# Patient Record
Sex: Male | Born: 1940
Health system: Southern US, Community
[De-identification: ages and names within clinical notes are randomized; demographics above are authoritative.]

## PROBLEM LIST (undated history)

## (undated) DIAGNOSIS — K649 Unspecified hemorrhoids: Secondary | ICD-10-CM

## (undated) DIAGNOSIS — R6889 Other general symptoms and signs: Secondary | ICD-10-CM

## (undated) DIAGNOSIS — Z9289 Personal history of other medical treatment: Secondary | ICD-10-CM

## (undated) DIAGNOSIS — M1990 Unspecified osteoarthritis, unspecified site: Secondary | ICD-10-CM

## (undated) DIAGNOSIS — Z8249 Family history of ischemic heart disease and other diseases of the circulatory system: Secondary | ICD-10-CM

## (undated) DIAGNOSIS — G4730 Sleep apnea, unspecified: Secondary | ICD-10-CM

## (undated) HISTORY — DX: Personal history of other medical treatment: Z92.89

## (undated) HISTORY — PX: KNEE ARTHROSCOPY: SUR90

## (undated) HISTORY — DX: Family history of ischemic heart disease and other diseases of the circulatory system: Z82.49

## (undated) HISTORY — PX: CATARACT EXTRACTION, BILATERAL: SHX1313

---

## 1994-12-16 DIAGNOSIS — Z9289 Personal history of other medical treatment: Secondary | ICD-10-CM

## 1994-12-16 HISTORY — DX: Personal history of other medical treatment: Z92.89

## 2001-09-03 ENCOUNTER — Ambulatory Visit (HOSPITAL_COMMUNITY): Admission: RE | Admit: 2001-09-03 | Discharge: 2001-09-03 | Payer: Self-pay | Admitting: Specialist

## 2001-09-03 ENCOUNTER — Encounter: Payer: Self-pay | Admitting: Family Medicine

## 2001-09-30 ENCOUNTER — Ambulatory Visit (HOSPITAL_COMMUNITY): Admission: RE | Admit: 2001-09-30 | Discharge: 2001-09-30 | Payer: Self-pay | Admitting: General Surgery

## 2004-05-07 ENCOUNTER — Ambulatory Visit: Admission: RE | Admit: 2004-05-07 | Discharge: 2004-05-07 | Payer: Self-pay | Admitting: Internal Medicine

## 2005-03-26 ENCOUNTER — Ambulatory Visit (HOSPITAL_COMMUNITY): Admission: RE | Admit: 2005-03-26 | Discharge: 2005-03-26 | Payer: Self-pay | Admitting: Family Medicine

## 2006-07-01 ENCOUNTER — Ambulatory Visit (HOSPITAL_COMMUNITY): Admission: RE | Admit: 2006-07-01 | Discharge: 2006-07-01 | Payer: Self-pay | Admitting: General Surgery

## 2007-09-07 ENCOUNTER — Ambulatory Visit: Payer: Self-pay | Admitting: Orthopedic Surgery

## 2007-09-11 DIAGNOSIS — Z8679 Personal history of other diseases of the circulatory system: Secondary | ICD-10-CM | POA: Insufficient documentation

## 2007-11-02 ENCOUNTER — Encounter: Payer: Self-pay | Admitting: Orthopedic Surgery

## 2007-11-20 ENCOUNTER — Encounter: Payer: Self-pay | Admitting: Orthopedic Surgery

## 2008-01-05 ENCOUNTER — Inpatient Hospital Stay (HOSPITAL_COMMUNITY): Admission: RE | Admit: 2008-01-05 | Discharge: 2008-01-08 | Payer: Self-pay | Admitting: Orthopedic Surgery

## 2008-01-06 ENCOUNTER — Ambulatory Visit: Payer: Self-pay | Admitting: Physical Medicine & Rehabilitation

## 2008-01-08 ENCOUNTER — Inpatient Hospital Stay: Admission: AD | Admit: 2008-01-08 | Discharge: 2008-01-15 | Payer: Self-pay | Admitting: Internal Medicine

## 2008-04-25 ENCOUNTER — Ambulatory Visit (HOSPITAL_BASED_OUTPATIENT_CLINIC_OR_DEPARTMENT_OTHER): Admission: RE | Admit: 2008-04-25 | Discharge: 2008-04-26 | Payer: Self-pay | Admitting: Orthopedic Surgery

## 2010-05-17 ENCOUNTER — Ambulatory Visit (HOSPITAL_COMMUNITY): Admission: RE | Admit: 2010-05-17 | Discharge: 2010-05-17 | Payer: Self-pay | Admitting: Ophthalmology

## 2010-06-07 ENCOUNTER — Ambulatory Visit (HOSPITAL_COMMUNITY): Admission: RE | Admit: 2010-06-07 | Discharge: 2010-06-07 | Payer: Self-pay | Admitting: Ophthalmology

## 2011-03-04 LAB — BASIC METABOLIC PANEL
BUN: 14 mg/dL (ref 6–23)
CO2: 29 mEq/L (ref 19–32)
Calcium: 9.4 mg/dL (ref 8.4–10.5)
Chloride: 104 mEq/L (ref 96–112)
Creatinine, Ser: 1.17 mg/dL (ref 0.4–1.5)
GFR calc Af Amer: >60 mL/min (ref >60)
GFR calc non Af Amer: >60 mL/min (ref >60)
Glucose, Bld: 111 mg/dL — ABNORMAL HIGH (ref 70–99)
Potassium: 3.7 mEq/L (ref 3.5–5.1)
Sodium: 139 mEq/L (ref 135–145)

## 2011-03-04 LAB — HEMOGLOBIN AND HEMATOCRIT, BLOOD
HCT: 42 % (ref 39.0–52.0)
Hemoglobin: 14.7 g/dL (ref 13.0–17.0)

## 2011-04-30 NOTE — Op Note (Signed)
NAME:  SALAM, MICUCCI NO.:  [id number]   MEDICAL RECORD NO.:  [id number]          PATIENT TYPE:  INP   LOCATION:  0005                         FACILITY:  [facility]   PHYSICIAN:  Madlyn Frankel. Charlann Boxer, M.D.  DATE OF BIRTH:  [birth year]   DATE OF PROCEDURE:  01/05/2008  DATE OF DISCHARGE:                               OPERATIVE REPORT   SURGEON:  Madlyn Frankel. Charlann Boxer, MD.   ASSISTANT:  Dwyane Luo, PA-C.   PREOPERATIVE DIAGNOSIS:  Bilateral medial knee osteoarthritis status  post left partial knee replacement.   POSTOPERATIVE DIAGNOSIS:  Bilateral medial knee osteoarthritis status  post left partial knee replacement.   PROCEDURE:  Right partial knee replacement, part of bilateral partial  knee replacement.   ANESTHESIA:  General.   Components and utilizers dictated in the previous note, but on the right  knee, it was a size medium femur, right medial size C tibia with 3 mm  insert.   TOURNIQUET TIME:  42 minutes at 250 mmHg.   DRAINS:  Drains x1.   INDICATIONS FOR PROCEDURE:  Later as previously noted.   PROCEDURE IN DETAIL:  The patient was brought to the operative theater.  After the left knee had been completed and dressed sterilely with ice  pack, attention was directed to the right knee.  A tourniquet was placed  into the right thigh and positioned on the leg holder.  At this point, I  pre-scrubbed and prepped and draped this right knee in sterile fashion.  The leg was exsanguinated and the tourniquet elevated to 250 mmHg.   A midline incision was made followed by median parapatellar arthrotomy.  Following initial debridement, I debrided osteophytes off the medial  femur from the anterior to the middle portion as well as in the notch.  Following a slight proximal exposure of the proximal tibia, I placed the  extramedullary device and resected the bone using a reciprocating saw  and then an oscillating saw.  The cut surface at this point was measured  to be more  compatible with size C components as opposed to the B.  My  flexion gap at this point was perfect with a size 3 and the lower  cemented with a 4.  I was happy with this.  At this point, a starting  awl was used to create a hole in the distal femur 1 cm anterior to the  posterior cruciate ligament insertion.  An intramedullary rod was then  passed.  Using the posterior cutting guide, I then positioned it on top  of a 3 feeler gauge, and once it was oriented in all planes parallel and  perpendicular and in the center portion of the medial femoral condyle, I  drilled in position.  The posterior cutting block was then placed and  the posterior femoral cut made.  At this point, the zero spigot was  placed and the distal femur milled.  A trial reduction was carried out.  At this point, again, I was happy with a size 3 in flexion.  However, it  was very tight in extension, even with a size 1.  For this reason, I  chose a size of 4 spigot.  The 4 spigot was placed and the distal femur  milled after using an osteotome to debride the remaining bone as well as  a rongeur in the central portion.  I retrialed with a size medium femur  and 3 insert.  At this point, the knee extension and flexion gaps  appeared to be very well balanced.  At this point, I removed the trial  femur, pinned the trial tibial component in place, and used a  reciprocating saw, debrided the trunk.  I cleared this out with an  appropriate chisel.  At this point, I drilled until the final components  were opened.  I irrigated the knee copiously with normal saline  solution.  Cement was mixed in 2 batches, first in the tibia and then  the femur.   The components were cemented into position in 2 cement techniques,  removing the cement after the tibial component using the trial femur and  the feeler gauge.  The procedure was repeated with the femoral  component.  After the cement had cured, I removed excessive cement and  was  satisfied there were no loose fragments of cement throughout the  wound.  I retrialed and was happy with a size 3 component.  The right  medial size 3 insert was then pushed in with my thumb without  significant issues.  I again irrigated this side of the knee.  The  tourniquet was let down at 42 minutes.  A medium Hemovac drain was  placed deep.  This synovial capsule layer was injected with 0.25%  Marcaine with epinephrine and Toradol.  The remaining portion from the  60 mL .   The extensor mechanism was then reapproximated using #1 Vicryl, 2-0  Vicryl in the subcutaneous layer, and a running 4-0 Monocryl.  The knee  was cleaned, dried, dressed sterilely with Steri-Strips, and a bulky  sterile wrap.  He was brought to the recovery room in stable condition.      Madlyn Frankel Charlann Boxer, M.D.  Electronically Signed     MDO/MEDQ  D:  01/05/2008  T:  01/05/2008  Job:  [id number]   cc:   Sharlet Salina L. [address], [place]

## 2011-04-30 NOTE — Op Note (Signed)
NAME:  Chris Taylor, Chris Taylor            ACCOUNT NO.:  [id number]   MEDICAL RECORD NO.:  [id number]          PATIENT TYPE:  AMB   LOCATION:  [facility]                         FACILITY:  [facility]   PHYSICIAN:  Madlyn Frankel. Charlann Boxer, M.D.  DATE OF BIRTH:  05-01-1941   DATE OF PROCEDURE:  04/25/2008  DATE OF DISCHARGE:                               OPERATIVE REPORT   PREOPERATIVE DIAGNOSIS:  Retained and fragmented cement following  partial knee replacement right knee.   POSTOPERATIVE DIAGNOSIS:  1. Cement fragment identified and removed.  2. Grade 3 changes in the patellofemoral compartment.  3. Synovitis and scarring.   PROCEDURE:  1. Right knee diagnostic and operative arthroscopy with a significant      synovectomy and scar debridement allowing for #2.  2. Removal of cement fragment.  3. Patellofemoral chondroplasty.   SURGEON:  Madlyn Frankel. Charlann Boxer, M.D.   ASSISTANT:  None.   ANESTHESIA:  General plus locally applied anesthesia.   COMPLICATIONS:  None.   DRAINS:  None.   INDICATIONS FOR PROCEDURE:  The patient is a very pleasant 70 year old  gentleman who has undergone bilateral partial knee replacements.  Unfortunately he had a fragment of cement break off that became  symptomatic to him.  This was identified radiographically.  Given the  mechanical symptoms he was having, he wished to have it removed and I  felt this was very justifiable.  We did discuss the risks and benefits  of knee arthroscopy in this setting including infection, the plan to use  antibiotics postoperatively to help prevent that, in addition to the  risks of persistent discomfort as well as the potential for finding of  the problems that are addressed inside the knee.  Consent was obtained.   DESCRIPTION OF PROCEDURE:  The patient was brought to the operating  room.  Once adequate anesthesia, preoperative antibiotics, Ancef,  administered, the patient was positioned supine in the right leg in a  leg holder.  The right  lower extremity was prescrubbed and prepped and  draped in the usual sterile fashion.  Standard inferolateral and  superior medial portals were utilized.  I used a horizontal portal  medially which was away from the initial incision for the index  procedure.  Diagnostic evaluation of the knee initially revealed a lot  of synovitis.   Through the inferomedial portal as my working portal, a lot of the  procedure initially was performed as synovectomy anterior medial and  lateral including the scar tissue.  After the considerable amount of  time of this debridement, I was able to identify the fragment of cement.  Using the pituitary rongeur in the extended incision medially I was able  to remove this cement.  It did break into two pieces.  There were no  other fragments of cement identified.   Following the synovectomy, I also identified some anterior compartment  osteoarthritic changes for which I used a 3.5 shaver to debride back to  stable levels.   Following the removal of cement, the synovectomy, and the chondroplasty,  I reexamined the knee to make sure there was no loose fragments or  cartilage.  The patient's partial knee replacement components looked  like they were in excellent position and without any signs of wear or  damage.  Following this, I removed the instrumentation.  I reclosed the  superior medial and inferolateral portals routinely with nylon.  I then  used a 2-0 Vicryl and nylon on the inferior medial portal.  At this  point the knee was reinjected with 30 mL of 0.25% Marcaine with  epinephrine.  Preoperatively, I had utilized lidocaine with epinephrine.  The knee was then dressed into a sterile bulky subcutaneous dressing.  He was brought to the recovery room in stable condition tolerating the  procedure well.  The plan is for him to spend the night overnight for  observation and discharge in the morning.      Madlyn Frankel Charlann Boxer, M.D.  Electronically  Signed     MDO/MEDQ  D:  04/25/2008  T:  04/25/2008  Job:  [id number]

## 2011-04-30 NOTE — Op Note (Signed)
NAME:  Chris Taylor, Chris Taylor NO.:  [id number]   MEDICAL RECORD NO.:  [id number]          PATIENT TYPE:  INP   LOCATION:  0005                         FACILITY:  [facility]   PHYSICIAN:  Madlyn Frankel. Charlann Boxer, M.D.  DATE OF BIRTH:  04-04-1941   DATE OF PROCEDURE:  01/05/2008  DATE OF DISCHARGE:                               OPERATIVE REPORT   PREOPERATIVE DIAGNOSIS:  Bilateral medial compartment osteoarthritis.   POSTOPERATIVE DIAGNOSIS:  Bilateral medial compartment osteoarthritis.   PROCEDURE:  Bilateral partial knee replacement using [employer] components.  The left knee had a size medium femur, a size B medial tibia, and a 3 mm  insert. The right side was a medium femur, a size medial size C tibia,  and 3 mm insert.   SURGEON:  Madlyn Frankel. Charlann Boxer, M.D.   ASSISTANT:  Yetta Glassman. Mann, P.A.-C.   ANESTHESIA:  General.   ESTIMATED BLOOD LOSS:  Approximately to 200 mL for both sides.   DRAINS:  Two.   TOURNIQUET TIME:  On the left was 57 minutes at 250 mmHg, on the right  side 42 minutes at 250 mmHg.   COMPLICATIONS:  None.   INDICATIONS:  Mr. Baumgardner is a 70 year old gentleman kindly referred  for evaluation of bilateral knee osteoarthritis, medial compartment  predominant pain.  The patient had specifically located the medial side  of his knee based on this and his radiographs indicated end stage medial  compartment osteoarthritis, he wished to discuss total knee versus  partial knee replacement.  The risks and benefits were discussed  extensively including the potential progression of arthritis, persistent  postop pain in the patellofemoral and lateral components requiring  revision to total knee replacement, in addition to standard risks of  infection, DVT, component failure, need for revision.  Consent was  obtained.   PROCEDURE IN DETAIL:  This is for the left arthroplasty, the right knee  will be dictated separately with the same general information   The patient  was brought to the operative theater.  Once adequate  anesthesia and preoperative antibiotics, Ancef, were administered, the  patient was positioned supine.  A left thigh tourniquet was placed.  The  left leg was then positioned on the leg holder to allow for flexion to  120 degrees. At this point, I pre-scrubbed and prepped and draped the  left knee.  The leg was exsanguinated and tourniquet elevated to 250  mmHg.  A paramidline incision was made from the patella to the tubercle.  Exposure of the soft tissues and releasing the subcu layers were carried  out to allow for tissue mobilization.  A partial median arthrotomy was  created followed by debridement.  Following partial exposure of the  proximal medial tibia, I then removed all osteophytes off the femur from  the anterior back to the most medial side of the knee as well as into  the notch.  At this point, the extramedullary guide was positioned over  the proximal tibia and oriented in the correct planes.  I pinned it in  position and used the reciprocating saw and then the oscillating saw. I  checked with a size B tibia and the 3 and 4 feeler gauge, happy with the  3 and I decided, based on this, not to increase my cuts.  At this point,  the starting awl was placed into the distal femur and a guide rod passed  into the center of the canal.   At this point, I positioned the posterior femoral cutting jig on the mid  portion of the distal femur perpendicular and parallel in all planes  necessary in relationship to the intramedullary rod.  Once this was  done, I pinned and drilled the holes.  The posterior cutting block had  been placed and the posterior femoral cut made.  At this point, I used a  0 spigot and milled off the distal femur.  I trialed with a medium  femur, B tibia, and a 3 insert. In flexion, I was happy with the laxity  of the tissues and at extension, I had a hard time getting the 1 spacer  into 20 degrees of extension.   At this point, I decided I would use a 3  spigot and the 3 spigot was placed and the femur milled using an  osteotome. I debrided bone as well as using a rongeur to remove some of  the bone from the distal aspect of the knee.  A trial reduction was  again carried out.  At this point, with the trial components in, the 3  spacer felt very nice.  At this point, the trial femur was removed, the  tibial component trial was pinned in position and the reciprocating saw  used to the create the trough.  I then used the small chisel designed to  create this trough to remove extra.  At this point, a keeled trial was  carried out and the size B left medial tibia, medium size femur, and a 3  trial.  The stability of the knee from extension to flexion was very  good.  I used an osteotome to debride out a notch in the anterior aspect  of the distal femur to prevent impingement of the trial component.  At  this point, all trial components were removed and the final components  were opened holding the polyethylene insert.  I then used a drill to  drill into the sclerotic bone.  I irrigated the knee copiously and  injected 30 mL of 0.25% Marcaine with epinephrine and 1 mL of Toradol  into the synovial capsule layer.  The cement was mixed.  The components  were cemented in position, first on the tibia and extruded it out at 45  degrees, a trial was then placed removing excessive cement in the femur.  Once the cement had cured, with both components in place, I assured  myself there were no loose fragments of cement.  I re-trialed and was  happy with a size 3 insert.  At this point, the final size 3 insert for  the left medial component was opened and inserted with good thumb  pressure.  I reirrigated the knee at this point and placed a medium  Hemovac drain.  With the knee in flexion, I reapproximated the extensor  mechanism with #1 Vicryl.  The remainder of the wound was closed with 2-  0 Vicryl and running  4-0 Monocryl.  At this point, I went ahead and  closed this entire wound with Steri-Strips and a bulky sterile wrap.  The knee was then taken out of the leg holder and positioned  on the side  of the bed with an ice pack.   This is the end of the procedure on the left knee.      Madlyn Frankel Charlann Boxer, M.D.  Electronically Signed     MDO/MEDQ  D:  01/05/2008  T:  01/05/2008  Job:  [id number]

## 2011-04-30 NOTE — Discharge Summary (Signed)
Chris Taylor, Chris Taylor NO.:  [id number]   MEDICAL RECORD NO.:  [id number]          PATIENT TYPE:  INP   LOCATION:  1608                         FACILITY:  [facility]   PHYSICIAN:  Madlyn Frankel. Charlann Boxer, M.D.  DATE OF BIRTH:  12-15-1941   DATE OF ADMISSION:  01/05/2008  DATE OF DISCHARGE:  01/08/2008                               DISCHARGE SUMMARY   ADMITTING DIAGNOSES:  1. Osteoarthritis.  2. Hypertension.  3. Sleep apnea.   DISCHARGE DIAGNOSES:  1. Osteoarthritis.  2. Hypertension.  3. Sleep apnea.   CONSULTATION:  None.   PROCEDURE:  Bilateral partial knee replacement.  Surgeon Dr. Durene Romans.  Assistant Dwyane Luo PA.   BRIEF HISTORY OF PRESENT ILLNESS:  Chris Taylor is a very pleasant 69-  year-old male with a history of bilateral knee pain with mainly medial  compartment degenerative changes secondary to osteoarthritis.  He was  refractory to all conservative treatment including oral anti-  inflammatories and cortisone injection.  Presurgically assessed and  cleared for bilateral partial knee replacement.   LABORATORY DATA:  Preadmission CBC:  Hematocrit 42.1; tracked throughout  his course of stay, had a dip down to 34, was stable at time of  discharge at 32.3.  CMET on admission:  All within normal limits; good  kidney function, potassium 9.7, GFR greater than 60.  Tracked throughout  his course of stay.  No significant trending.  Was stable at discharge  with sodium 142, potassium 4.1, creatinine 1.15, GFR greater than 60,  potassium 8.5.  Glucose 100.  Preadmission UA was negative.   RADIOLOGY:  Chest two-view showed no acute disease.  No EKG could be  found on chart at time of discharge.   HOSPITAL COURSE:  The patient underwent bilateral partial knee  replacement, tolerated the procedure well, was admitted to the  orthopedic floor.  During his rehab stay on the orthopedic floor was  unremarkable.  He made good progress, had no medical complications.   He  remained afebrile.  Hemodynamically, he remained stable without the need  for transfusion.  Dressing was changed on a daily basis with no sign of  infection, no ooze.  He was able to independently transfer from bed to  chair and chair to bathroom.  I made good progress with his ambulation.  He had good strength.  Pain was well-controlled as well.  After three  days, he was stable, improved, ready for discharge to a skilled nursing  facility rehab facility for further progress as he does live alone.   DISCHARGE DISPOSITION:  Discharged in stable and improved condition to  skilled nursing facility rehab for further progress.   DISCHARGE PHYSICAL THERAPY:  He is weightbearing as tolerated bilateral  lower extremities with use of rolling walker for approximately 2 weeks  and then transfer to single-point cane as tolerated.  Goals of physical  therapy will be to work on gait training, proprioception, minimize pain,  increase strength and encourage independence of activities of daily  living.   DISCHARGE WOUND CARE:  Keep wounds dry.  Change dressing on a daily  basis.  If  the patient does want to shower, cover with plastic, tape  edges, do not get wet.   DISCHARGE MEDICATIONS:  1. Benicar hydrochlorothiazide 20/12.5 one p.o. daily.  2. Aspirin 81 mg 1 p.o. daily.  3. Omega 3 hold for 2 weeks.  4. Cod liver hold for 2 weeks.  5. Flaxseed oil hold for 2 weeks.  6. Vitamin B12 1000 mcg daily.  7. Benadryl 25 mg p.o. q.6h. p.r.n.  8. Celebrex 200 mg 1 p.o. daily x2 weeks.  9. Lovenox 40 mg subcutaneous q.24h. for 11 days.  10.Robaxin 500 mg 1 p.o. q.6h. p.r.n. muscle spasm pain.  11.Vicodin 5/325 one to two p.o. q.4-6h. p.r.n. pain.  12.Iron 325 mg 1 p.o. t.i.d. x2 weeks.  13.After Lovenox completed, enteric-coated aspirin 325 mg 1 p.o. daily      for 4 weeks, after Lovenox completed.  This would be as a      substitute for the 81-mg aspirin.  14.Colace 100 mg p.o. daily.   15.MiraLax 17 g p.o. daily.  16.Enema of choice, laxative of choice.   DISCHARGE FOLLOWUP:  Follow up with Dr. Charlann Boxer at phone number [phone] in  2 weeks for follow-up wound check.   DISCHARGE DIET:  Regular as tolerated by the patient.     ______________________________  Yetta Glassman. [address], [place]      Madlyn Frankel. Charlann Boxer, M.D.  Electronically Signed    BLM/MEDQ  D:  01/08/2008  T:  01/08/2008  Job:  [id number]

## 2011-04-30 NOTE — H&P (Signed)
NAME:  Chris Taylor, SERPE NO.:  [id number]   MEDICAL RECORD NO.:  [id number]          PATIENT TYPE:  INP   LOCATION:  NA                           FACILITY:  [facility]   PHYSICIAN:  Madlyn Frankel. Charlann Boxer, M.D.  DATE OF BIRTH:  01-08-1941   DATE OF ADMISSION:  DATE OF DISCHARGE:                              HISTORY & PHYSICAL   DATE OF ADMISSION:  January 05, 2008 to [facility], surgeon  Dr. Durene Romans.   PROCEDURE:  Bilateral partial knee replacement.   CHIEF COMPLAINT:  Bilateral knee pain.   HISTORY OF PRESENT ILLNESS:  70 year old male with a history of  bilateral knee pain with mainly medial compartment degenerative changes  secondary to osteoarthritis.  He has been refractory to all conservative  treatment.  He has been presurgical assessed by Melony Overly, physician  assistant with [facility].   PAST MEDICAL HISTORY:  Significant for osteoarthritis, hypertension.   PAST SURGICAL HISTORY:  None.   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  Primary caregiver will be a rehab facility postoperatively.   DRUG ALLERGIES:  NO KNOWN DRUG ALLERGIES.   MEDICATIONS:  1. Benicar 20 mg one p.o. daily  2. Aspirin 81 mg one p.o. daily.   REVIEW OF SYSTEMS:  None other than HPI.   PHYSICAL EXAMINATION:  Pulse 72, respirations 18, blood pressure 98/54.  GENERAL:  Awake, alert and oriented, well-developed, well-nourished, no  acute distress.  NECK:  Supple.  No carotid bruits.  CHEST:  Lungs are clear to auscultation bilaterally.  BREASTS:  Deferred.  HEART:  Regular rate and rhythm.  S1-S2 distinct.  ABDOMEN:  Soft, nontender, bowel sounds present.  GENITOURINARY:  Deferred.  EXTREMITIES:  Dorsalis pedis pulse positive bilateral lower extremities.  Bilateral knees have mild flexion contracture.  SKIN:  No cellulitis.  NEUROLOGIC:  Intact distal sensibilities.   Labs, EKG, chest x-ray all pending presurgical  testing.   IMPRESSION:  Bilateral medial compartment osteoarthritis.   PLAN OF ACTION:  Bilateral unicondylar knee arthroplasty.  Surgeon Dr.  Durene Romans, January 05, 2008 [facility].  Risks,  complications were discussed.   Postoperative rehab would be to in-house rehab, whether [facility] or  would prefer [facility] as that is near his  home.  Therefore upon  admission and postoperatively we would seek rehab placement due to the  nature of bilateral knee replacement at [facility].     ______________________________  Yetta Glassman. [address], [place]      Madlyn Frankel. Charlann Boxer, M.D.  Electronically Signed    BLM/MEDQ  D:  12/31/2007  T:  12/31/2007  Job:  [id number]   cc:   Melony Overly, PA   Corrie Mckusick, M.D.  Fax: [phone]

## 2011-05-03 NOTE — H&P (Signed)
NAME:  SPARSH, CALLENS NO.:  [id number]   MEDICAL RECORD NO.:  [id number]           PATIENT TYPE:  AMB   LOCATION:                                FACILITY:  [facility]   PHYSICIAN:  Dalia Heading, M.D.  DATE OF BIRTH:  Jun 07, 1941   DATE OF ADMISSION:  DATE OF DISCHARGE:  LH                                HISTORY & PHYSICAL   CHIEF COMPLAINT:  Family history of colon carcinoma.   HISTORY OF PRESENT ILLNESS:  The patient is a 70 year old black male who is  referred for endoscopic evaluation.  He needs a colonoscopy for family  history of colon carcinoma.  His brother died of colon cancer.  No abdominal  pain, weight loss, nausea, vomiting, diarrhea, constipation, melena, or  hematochezia have been noted.  He last had a colonoscopy 6 years ago.   PAST MEDICAL HISTORY:  Unremarkable.   PAST SURGICAL HISTORY:  Unremarkable.   CURRENT MEDICATIONS:  Benicar, baby aspirin.   ALLERGIES:  No known drug allergies.   REVIEW OF SYSTEMS:  Noncontributory.   PHYSICAL EXAMINATION:  GENERAL:  The patient is a well-developed, well-  nourished black male in no acute distress.  LUNGS:  Clear to auscultation with equal breath sounds bilaterally.  HEART:  Reveals a regular rate and rhythm without S3, S4, or murmurs.  ABDOMEN:  Soft, nontender, nondistended.  No hepatosplenomegaly or masses  noted.  RECTAL:  Examination was deferred to the procedure.   IMPRESSION:  Family history of colon carcinoma.   PLAN:  The patient is scheduled for a colonoscopy on July 01, 2006.  The  risks and benefits of the procedure including bleeding and perforation were  fully explained to the patient, who gave informed consent.      Dalia Heading, M.D.  Electronically Signed     MAJ/MEDQ  D:  06/26/2006  T:  06/26/2006  Job:  [id number]   cc:   Corrie Mckusick, M.D.  Fax: [phone]

## 2011-09-05 LAB — URINALYSIS, ROUTINE W REFLEX MICROSCOPIC
Bilirubin Urine: NEGATIVE
Glucose, UA: NEGATIVE
Hgb urine dipstick: NEGATIVE
Ketones, ur: NEGATIVE
Nitrite: NEGATIVE
Protein, ur: NEGATIVE
Specific Gravity, Urine: 1.029
Urobilinogen, UA: 0.2
pH: 6.5

## 2011-09-05 LAB — TYPE AND SCREEN

## 2011-09-05 LAB — CBC
HCT: 32.3 — ABNORMAL LOW
HCT: 34 — ABNORMAL LOW
Hemoglobin: 11.7 — ABNORMAL LOW
MCHC: 34.6
MCV: 93
MCV: 93.8
MCV: 94.7
Platelets: 177
Platelets: 216
RBC: 4.53
RDW: 12.8
RDW: 12.8
RDW: 13
WBC: 7.9

## 2011-09-05 LAB — BASIC METABOLIC PANEL
BUN: 11
BUN: 15
CO2: 30
Calcium: 9.7
Chloride: 103
Chloride: 105
Chloride: 109
Creatinine, Ser: 1.19
GFR calc non Af Amer: >60
Glucose, Bld: 100 — ABNORMAL HIGH
Glucose, Bld: 102 — ABNORMAL HIGH
Glucose, Bld: 97
Potassium: 4
Potassium: 4.1
Sodium: 139

## 2011-09-05 LAB — DIFFERENTIAL
Basophils Absolute: 0
Basophils Relative: 1
Eosinophils Absolute: 0.2
Monocytes Relative: 11
Neutro Abs: 1.5 — ABNORMAL LOW
Neutrophils Relative %: 41 — ABNORMAL LOW

## 2011-09-05 LAB — PROTIME-INR: INR: 1

## 2012-01-17 ENCOUNTER — Telehealth: Payer: Self-pay | Admitting: Family

## 2012-01-17 NOTE — Telephone Encounter (Signed)
Please call pt and let him know that his stress test is normal.

## 2012-01-17 NOTE — Telephone Encounter (Signed)
Phone note opened in error on wrong patient.

## 2013-05-13 ENCOUNTER — Encounter (HOSPITAL_COMMUNITY): Payer: Self-pay | Admitting: Pharmacy Technician

## 2013-05-13 NOTE — Patient Instructions (Addendum)
Chris Taylor  05/13/2013   Your procedure is scheduled on:   05/17/2013  Report to [facility] at  1025  AM.  Call this number if you have problems the morning of surgery: [phone]   Remember:   Do not eat food or drink liquids after midnight.   Take these medicines the morning of surgery with A SIP OF WATER: Namenda   Do not wear jewelry, make-up or nail polish.  Do not wear lotions, powders, or perfumes.   Do not shave 48 hours prior to surgery. Men may shave face and neck.  Do not bring valuables to the hospital.  [facility] is not responsible   for any belongings or valuables.  Contacts, dentures or bridgework may not be worn into surgery.  Leave suitcase in the car. After surgery it may be brought to your room.  For patients admitted to the hospital, checkout time is 11:00 AM the day of discharge.   Patients discharged the day of surgery will not be allowed to drive  home.  Name and phone number of your driver: family  Special Instructions: Shower using CHG 2 nights before surgery and the night before surgery.  If you shower the day of surgery use CHG.  Use special wash - you have one bottle of CHG for all showers.  You should use approximately 1/3 of the bottle for each shower.   Please read over the following fact sheets that you were given: Pain Booklet, Coughing and Deep Breathing, MRSA Information, Surgical Site Infection Prevention, Anesthesia Post-op Instructions and Care and Recovery After Surgery Hemorrhoidectomy Hemorrhoidectomy is surgery to remove hemorrhoids. Hemorrhoids are veins that have become swollen in the rectum. The rectum is the area from the bottom end of the intestines to the opening where bowel movements leave the body. Hemorrhoids can be uncomfortable. They can cause itching, bleeding and pain if a blood clot forms in them (thrombose). If hemorrhoids are small, surgery may not be needed. But if they cover a larger area, surgery is usually suggested.   LET YOUR CAREGIVER KNOW ABOUT:   Any allergies.  All medications you are taking, including:  Herbs, eyedrops, over-the-counter medications and creams.  Blood thinners (anticoagulants), aspirin or other drugs that could affect blood clotting.  Use of steroids (by mouth or as creams).  Previous problems with anesthetics, including local anesthetics.  Possibility of pregnancy, if this applies.  Any history of blood clots.  Any history of bleeding or other blood problems.  Previous surgery.  Smoking history.  Other health problems. RISKS AND COMPLICATIONS All surgery carries some risk. However, hemorrhoid surgery usually goes smoothly. Possible complications could include:  Urinary retention.  Bleeding.  Infection.  A painful incision.  A reaction to the anesthesia (this is not common). BEFORE THE PROCEDURE   Stop using aspirin and non-steroidal anti-inflammatory drugs (NSAIDs) for pain relief. This includes prescription drugs and over-the-counter drugs such as ibuprofen and naproxen. Also stop taking vitamin E. If possible, do this two weeks before your surgery.  If you take blood-thinners, ask your healthcare provider when you should stop taking them.  You will probably have blood and urine tests done several days before your surgery.  Do not eat or drink for about 8 hours before the surgery.  Arrive at least an hour before the surgery, or whenever your surgeon recommends. This will give you time to check in and fill out any needed paperwork.  Hemorrhoidectomy is often an outpatient  procedure. This means you will be able to go home the same day. Sometimes, though, people stay overnight in the hospital after the procedure. Ask your surgeon what to expect. Either way, make arrangements in advance for someone to drive you home. PROCEDURE   The preparation:  You will change into a hospital gown.  You will be given an IV. A needle will be inserted in your arm.  Medication can flow directly into your body through this needle.  You might be given an enema to clear your rectum.  Once in the operating room, you will probably lie on your side or be repositioned later to lying on your stomach.  You will be given anesthesia (medication) so you will not feel anything during the surgery. The surgery often is done with local anesthesia (the area near the hemorrhoids will be numb and you will be drowsy but awake). Sometimes, general anesthesia is used (you will be asleep during the procedure).  The procedure:  There are a few different procedures for hemorrhoids. Be sure to ask you surgeon about the procedure, the risks and benefits.  Be sure to ask about what you need to do to take care of the wound, if there is one. AFTER THE PROCEDURE  You will stay in a recovery area until the anesthesia has worn off. Your blood pressure and pulse will be checked every so often.  You may feel a lot of pain in the area of the rectum.  Take all pain medication prescribed by your surgeon. Ask before taking any over-the-counter pain medicines.  Sometimes sitting in a warm bath can help relieve your pain.  To make sure you have bowel movements without straining:  You will probably need to take stool softeners (usually a pill) for a few days.  You should drink 8 to 10 glasses of water each day.  Your activity will be restricted for awhile. Ask your caregiver for a list of what you should and should not do while you recover. Document Released: 09/29/2009 Document Revised: 02/24/2012 Document Reviewed: 09/29/2009 [facility] Patient Information 2014 [place], [place]. PATIENT INSTRUCTIONS POST-ANESTHESIA  IMMEDIATELY FOLLOWING SURGERY:  Do not drive or operate machinery for the first twenty four hours after surgery.  Do not make any important decisions for twenty four hours after surgery or while taking narcotic pain medications or sedatives.  If you develop intractable  nausea and vomiting or a severe headache please notify your doctor immediately.  FOLLOW-UP:  Please make an appointment with your surgeon as instructed. You do not need to follow up with anesthesia unless specifically instructed to do so.  WOUND CARE INSTRUCTIONS (if applicable):  Keep a dry clean dressing on the anesthesia/puncture wound site if there is drainage.  Once the wound has quit draining you may leave it open to air.  Generally you should leave the bandage intact for twenty four hours unless there is drainage.  If the epidural site drains for more than 36-48 hours please call the anesthesia department.  QUESTIONS?:  Please feel free to call your physician or the hospital operator if you have any questions, and they will be happy to assist you.

## 2013-05-14 ENCOUNTER — Encounter (HOSPITAL_COMMUNITY)
Admission: RE | Admit: 2013-05-14 | Discharge: 2013-05-14 | Disposition: A | Discharge status: Home / Self Care | Payer: Medicare Other | Source: Ambulatory Visit | Attending: General Surgery | Admitting: General Surgery

## 2013-05-14 ENCOUNTER — Encounter (HOSPITAL_COMMUNITY): Payer: Self-pay

## 2013-05-14 HISTORY — DX: Unspecified hemorrhoids: K64.9

## 2013-05-14 HISTORY — DX: Sleep apnea, unspecified: G47.30

## 2013-05-14 HISTORY — DX: Other general symptoms and signs: R68.89

## 2013-05-14 LAB — BASIC METABOLIC PANEL
BUN: 12 mg/dL (ref 6–23)
Calcium: 9.3 mg/dL (ref 8.4–10.5)
Creatinine, Ser: 1.07 mg/dL (ref 0.50–1.35)
GFR calc non Af Amer: 67 mL/min — ABNORMAL LOW (ref >90)
Glucose, Bld: 83 mg/dL (ref 70–99)

## 2013-05-14 LAB — CBC WITH DIFFERENTIAL/PLATELET
Basophils Relative: 1 % (ref 0–1)
Eosinophils Absolute: 0.2 10*3/uL (ref 0.0–0.7)
MCH: 32.1 pg (ref 26.0–34.0)
MCHC: 35.5 g/dL (ref 30.0–36.0)
Monocytes Relative: 12 % (ref 3–12)
Neutrophils Relative %: 41 % — ABNORMAL LOW (ref 43–77)
Platelets: 183 10*3/uL (ref 150–400)

## 2013-05-17 ENCOUNTER — Ambulatory Visit (HOSPITAL_COMMUNITY): Payer: Medicare Other | Admitting: Anesthesiology

## 2013-05-17 ENCOUNTER — Encounter (HOSPITAL_COMMUNITY): Payer: Self-pay | Admitting: *Deleted

## 2013-05-17 ENCOUNTER — Encounter (HOSPITAL_COMMUNITY)
Admission: RE | Disposition: A | Discharge status: Home / Self Care | Payer: Self-pay | Source: Ambulatory Visit | Attending: General Surgery

## 2013-05-17 ENCOUNTER — Encounter (HOSPITAL_COMMUNITY): Payer: Self-pay | Admitting: Anesthesiology

## 2013-05-17 ENCOUNTER — Ambulatory Visit (HOSPITAL_COMMUNITY)
Admission: RE | Admit: 2013-05-17 | Discharge: 2013-05-17 | Disposition: A | Discharge status: Home / Self Care | Payer: Medicare Other | Source: Ambulatory Visit | Attending: General Surgery | Admitting: General Surgery

## 2013-05-17 DIAGNOSIS — I10 Essential (primary) hypertension: Secondary | ICD-10-CM | POA: Insufficient documentation

## 2013-05-17 DIAGNOSIS — G4733 Obstructive sleep apnea (adult) (pediatric): Secondary | ICD-10-CM | POA: Insufficient documentation

## 2013-05-17 DIAGNOSIS — K644 Residual hemorrhoidal skin tags: Secondary | ICD-10-CM | POA: Insufficient documentation

## 2013-05-17 HISTORY — PX: HEMORRHOID SURGERY: SHX153

## 2013-05-17 SURGERY — HEMORRHOIDECTOMY
Anesthesia: General | Wound class: Clean Contaminated

## 2013-05-17 MED ORDER — DEXTROSE 5 % IV SOLN
INTRAVENOUS | Status: AC
  Filled 2013-05-17: qty 2

## 2013-05-17 MED ORDER — ARTIFICIAL TEARS OP OINT
TOPICAL_OINTMENT | OPHTHALMIC | Status: AC
  Filled 2013-05-17: qty 3.5

## 2013-05-17 MED ORDER — LIDOCAINE HCL (PF) 1 % IJ SOLN
INTRAMUSCULAR | Status: AC
  Filled 2013-05-17: qty 5

## 2013-05-17 MED ORDER — FENTANYL CITRATE 0.05 MG/ML IJ SOLN
INTRAMUSCULAR | Status: DC | PRN
  Administered 2013-05-17 (×4): 25 ug via INTRAVENOUS
  Administered 2013-05-17 (×2): 50 ug via INTRAVENOUS

## 2013-05-17 MED ORDER — ONDANSETRON HCL 4 MG/2ML IJ SOLN
INTRAMUSCULAR | Status: AC
  Filled 2013-05-17: qty 2

## 2013-05-17 MED ORDER — LIDOCAINE HCL (CARDIAC) 10 MG/ML IV SOLN
INTRAVENOUS | Status: DC | PRN
  Administered 2013-05-17: 50 mg via INTRAVENOUS

## 2013-05-17 MED ORDER — MIDAZOLAM HCL 2 MG/2ML IJ SOLN
1.0000 mg | INTRAMUSCULAR | Status: DC | PRN
  Administered 2013-05-17: 2 mg via INTRAVENOUS

## 2013-05-17 MED ORDER — LIDOCAINE VISCOUS 2 % MT SOLN
OROMUCOSAL | Status: DC | PRN
  Administered 2013-05-17: 15 mL

## 2013-05-17 MED ORDER — FENTANYL CITRATE 0.05 MG/ML IJ SOLN
INTRAMUSCULAR | Status: AC
  Filled 2013-05-17: qty 2

## 2013-05-17 MED ORDER — CEFOXITIN SODIUM 2 G IV SOLR
2.0000 g | INTRAVENOUS | Status: AC
  Administered 2013-05-17: 2 g via INTRAVENOUS

## 2013-05-17 MED ORDER — SURGILUBE EX GEL
CUTANEOUS | Status: DC | PRN
  Administered 2013-05-17: 1 via TOPICAL

## 2013-05-17 MED ORDER — DOCUSATE SODIUM 100 MG PO CAPS: 100.0000 mg | ORAL_CAPSULE | Freq: Two times a day (BID) | ORAL | Status: DC

## 2013-05-17 MED ORDER — LIDOCAINE VISCOUS 2 % MT SOLN
OROMUCOSAL | Status: AC
  Filled 2013-05-17: qty 15

## 2013-05-17 MED ORDER — 0.9 % SODIUM CHLORIDE (POUR BTL) OPTIME
TOPICAL | Status: DC | PRN
  Administered 2013-05-17: 1000 mL

## 2013-05-17 MED ORDER — FENTANYL CITRATE 0.05 MG/ML IJ SOLN
25.0000 ug | INTRAMUSCULAR | Status: DC | PRN
Start: 2013-05-17 — End: 2013-05-17

## 2013-05-17 MED ORDER — BUPIVACAINE-EPINEPHRINE PF 0.5-1:200000 % IJ SOLN
INTRAMUSCULAR | Status: AC
  Filled 2013-05-17: qty 10

## 2013-05-17 MED ORDER — PROPOFOL 10 MG/ML IV EMUL
INTRAVENOUS | Status: AC
  Filled 2013-05-17: qty 20

## 2013-05-17 MED ORDER — CELECOXIB 100 MG PO CAPS: 400.0000 mg | ORAL_CAPSULE | Freq: Every day | ORAL | Status: DC

## 2013-05-17 MED ORDER — ONDANSETRON HCL 4 MG/2ML IJ SOLN
4.0000 mg | Freq: Once | INTRAMUSCULAR | Status: AC
  Administered 2013-05-17: 4 mg via INTRAVENOUS

## 2013-05-17 MED ORDER — LACTATED RINGERS IV SOLN
INTRAVENOUS | Status: DC
  Administered 2013-05-17 (×2): via INTRAVENOUS

## 2013-05-17 MED ORDER — GLYCOPYRROLATE 0.2 MG/ML IJ SOLN
INTRAMUSCULAR | Status: AC
  Filled 2013-05-17: qty 1

## 2013-05-17 MED ORDER — MIDAZOLAM HCL 2 MG/2ML IJ SOLN
INTRAMUSCULAR | Status: AC
  Filled 2013-05-17: qty 2

## 2013-05-17 MED ORDER — PROPOFOL 10 MG/ML IV BOLUS
INTRAVENOUS | Status: DC | PRN
  Administered 2013-05-17: 150 mg via INTRAVENOUS

## 2013-05-17 MED ORDER — ONDANSETRON HCL 4 MG/2ML IJ SOLN: 4.0000 mg | Freq: Once | INTRAMUSCULAR | Status: DC | PRN

## 2013-05-17 MED ORDER — BUPIVACAINE-EPINEPHRINE 0.5% -1:200000 IJ SOLN
INTRAMUSCULAR | Status: DC | PRN
  Administered 2013-05-17: 10 mL

## 2013-05-17 MED ORDER — HYDROCODONE-ACETAMINOPHEN 5-325 MG PO TABS: 1.0000 | ORAL_TABLET | ORAL | Status: DC | PRN

## 2013-05-17 MED ORDER — GLYCOPYRROLATE 0.2 MG/ML IJ SOLN
0.2000 mg | Freq: Once | INTRAMUSCULAR | Status: AC
  Administered 2013-05-17: 0.2 mg via INTRAVENOUS

## 2013-05-17 SURGICAL SUPPLY — 31 items
BAG HAMPER (MISCELLANEOUS) ×2 IMPLANT
CLOTH BEACON ORANGE TIMEOUT ST (SAFETY) ×2 IMPLANT
COVER LIGHT HANDLE STERIS (MISCELLANEOUS) ×4 IMPLANT
COVER MAYO STAND XLG (DRAPE) ×2 IMPLANT
DECANTER SPIKE VIAL GLASS SM (MISCELLANEOUS) ×2 IMPLANT
DRAPE PROXIMA HALF (DRAPES) ×2 IMPLANT
ELECT REM PT RETURN 9FT ADLT (ELECTROSURGICAL) ×2
ELECTRODE REM PT RTRN 9FT ADLT (ELECTROSURGICAL) ×1 IMPLANT
FORMALIN 10 PREFIL 120ML (MISCELLANEOUS) ×2 IMPLANT
GLOVE BIOGEL PI IND STRL 7.5 (GLOVE) ×1 IMPLANT
GLOVE BIOGEL PI INDICATOR 7.5 (GLOVE) ×1
GLOVE ECLIPSE 7.0 STRL STRAW (GLOVE) ×2 IMPLANT
GLOVE EXAM NITRILE MD LF STRL (GLOVE) ×1 IMPLANT
GLOVE INDICATOR 7.0 STRL GRN (GLOVE) ×1 IMPLANT
GLOVE SS BIOGEL STRL SZ 6.5 (GLOVE) IMPLANT
GLOVE SUPERSENSE BIOGEL SZ 6.5 (GLOVE) ×1
GOWN STRL REIN XL XLG (GOWN DISPOSABLE) ×4 IMPLANT
HEMOSTAT SURGICEL 4X8 (HEMOSTASIS) ×2 IMPLANT
KIT ROOM TURNOVER APOR (KITS) ×2 IMPLANT
LIGASURE IMPACT 36 18CM CVD LR (INSTRUMENTS) ×1 IMPLANT
MANIFOLD NEPTUNE II (INSTRUMENTS) ×2 IMPLANT
NDL HYPO 25X1 1.5 SAFETY (NEEDLE) ×1 IMPLANT
NEEDLE HYPO 25X1 1.5 SAFETY (NEEDLE) ×2 IMPLANT
NS IRRIG 1000ML POUR BTL (IV SOLUTION) ×2 IMPLANT
PACK PERI GYN (CUSTOM PROCEDURE TRAY) ×2 IMPLANT
PAD ARMBOARD 7.5X6 YLW CONV (MISCELLANEOUS) ×2 IMPLANT
SET BASIN LINEN APH (SET/KITS/TRAYS/PACK) ×2 IMPLANT
SPONGE GAUZE 4X4 12PLY (GAUZE/BANDAGES/DRESSINGS) ×2 IMPLANT
SUT CHROMIC 2 0 CT 1 (SUTURE) ×2 IMPLANT
SUT SILK 0 FSL (SUTURE) ×2 IMPLANT
SYR CONTROL 10ML LL (SYRINGE) ×2 IMPLANT

## 2013-05-17 NOTE — Preoperative (Signed)
Beta Blockers   Reason not to administer Beta Blockers:Not Applicable 

## 2013-05-17 NOTE — Anesthesia Preprocedure Evaluation (Signed)
Anesthesia Evaluation  Patient identified by MRN, date of birth, ID band Patient awake    Reviewed: Allergy & Precautions, H&P , NPO status , Patient's Chart, lab work & pertinent test results  History of Anesthesia Complications Negative for: history of anesthetic complications  Airway Mallampati: I TM Distance: >3 FB Neck ROM: Full    Dental  (+) Teeth Intact and Implants   Pulmonary sleep apnea and Continuous Positive Airway Pressure Ventilation ,  breath sounds clear to auscultation        Cardiovascular hypertension, Rhythm:Regular Rate:Normal     Neuro/Psych    GI/Hepatic negative GI ROS,   Endo/Other    Renal/GU      Musculoskeletal   Abdominal   Peds  Hematology   Anesthesia Other Findings   Reproductive/Obstetrics                           Anesthesia Physical Anesthesia Plan  ASA: II  Anesthesia Plan: General   Post-op Pain Management:    Induction: Intravenous  Airway Management Planned: LMA  Additional Equipment:   Intra-op Plan:   Post-operative Plan: Extubation in OR  Informed Consent: I have reviewed the patients History and Physical, chart, labs and discussed the procedure including the risks, benefits and alternatives for the proposed anesthesia with the patient or authorized representative who has indicated his/her understanding and acceptance.     Plan Discussed with:   Anesthesia Plan Comments:         Anesthesia Quick Evaluation

## 2013-05-17 NOTE — Anesthesia Postprocedure Evaluation (Signed)
  Anesthesia Post-op Note  Patient: Chris Taylor  Procedure(s) Performed: Procedure(s): HEMORRHOIDECTOMY (N/A)  Patient Location: PACU  Anesthesia Type:General  Level of Consciousness: awake, alert , oriented and patient cooperative  Airway and Oxygen Therapy: Patient Spontanous Breathing and Patient connected to face mask oxygen  Post-op Pain: mild  Post-op Assessment: Post-op Vital signs reviewed, Patient's Cardiovascular Status Stable, Respiratory Function Stable, Patent Airway, No signs of Nausea or vomiting and Pain level controlled  Post-op Vital Signs: Reviewed and stable  Complications: No apparent anesthesia complications

## 2013-05-17 NOTE — Op Note (Signed)
Patient:  Chris Taylor  DOB:  [birth year]  MRN:  [id number]   Preop Diagnosis:  External hemorrhoids, prolapsed  Postop Diagnosis:  The same  Procedure:  Hemorrhoidectomy x2 columns  Surgeon:  Dr. Tilford Pillar  Anes:  General via larger mask airway, 1% lidocaine with epinephrine for local  Indications:  Patient presented to my office with a history of increasing perirectal pain. Workup and evaluation was consistent for medically refractory hemorrhoid disease. Risks benefits alternatives of excision were discussed with the patient including but not limited to risk of bleeding, infection, possible recurrence. Patient's questions and concerns are addressed the patient was consented for the planned procedure  Procedure note:  Patient is taken to the operator is placed in a supine position on the or table time the general anesthetic is a Optician, dispensing. Once patient was asleep a larger mask airway was placed. At this point his perineum was prepped with Betadine solution after placing an a high lithotomy position with yellowfin stirrups. Drapes are placed in standard fashion. Time out was performed. At this time digital rectal exam was performed with no noted masses or abnormalities. One of the prolapse hemorrhoidal columns was identified and grasped with an Allis clamp. This is retracted into the field and due to its pedunculated nature I opted to ligate this and divided it with the LigaSure. Excellent hemostasis was noted and the specimen was placed in the back table as a permanent specimen. At this time I redirected my attention to an additional column. This was also grasped with a Allis clamp and was elevated. I treated initial elliptical mucosal incision with electrocautery. The pedicle of the femoral tissue was then divided and ligated with the LigaSure device. The mucosal edges were reapproximated using a 3-0 chromic in running locking fashion. As quite pleased with the appearance. No additional  prominent hemorrhoidal tissue was encountered. At this time I turned my attention to completion of the procedure. After assuring ALT sponges were accounted for I placed a rolled Surgicel tampon into the rectum. A 2-0 silk was utilized to create the attached strings for retraction and removal of the tampon. The local anesthetic was instilled. Hemostasis is excellent. 4 x 4 gauze dressings were rolled and placed over the anal opening, the drapes removed, and the dressings were secured with Medipore tape. At the conclusion of procedure all instrument, sponge, needle counts are correct. Patient tolerated procedure extremely well. He is transferred to the PACU in stable condition.  Complications:  None apparent  EBL:  Less than 75 MLS  Specimen:  Hemorrhoidal tissue

## 2013-05-17 NOTE — Anesthesia Procedure Notes (Signed)
Procedure Name: LMA Insertion Date/Time: 05/17/2013 1:27 PM Performed by: Carolyne Littles, [name] Pre-anesthesia Checklist: Patient identified, Timeout performed, Emergency Drugs available, Suction available and Patient being monitored Patient Re-evaluated:Patient Re-evaluated prior to inductionOxygen Delivery Method: Circle system utilized Preoxygenation: Pre-oxygenation with 100% oxygen Ventilation: Mask ventilation without difficulty LMA Size: 5.0 Number of attempts: 1 Placement Confirmation: positive ETCO2 and breath sounds checked- equal and bilateral Tube secured with: Tape Dental Injury: Teeth and Oropharynx as per pre-operative assessment

## 2013-05-17 NOTE — H&P (Signed)
  NTS SOAP Note  Vital Signs:  Vitals as of: 05/13/2013: Systolic 151: Diastolic 91: Heart Rate 69: Temp 98.47F: Height 86ft 9in: Weight 182Lbs 0 Ounces: Pain Level 10: BMI 26.88  BMI : 26.88 kg/m2  Subjective: This 72 Years 2 Months old Male presents for of perirectal pain.  Pain has been increasing over the last year.  Known history of hemorrhoids.  Does feel bulging.  Some occ blood on TP.  NO change with BM.  He is taking a stool softner.  ON prep-H with no significant improvements.  Review of Symptoms:  Constitutional:unremarkable   Head:unremarkable    Eyes:unremarkable   Nose/Mouth/Throat:unremarkable Cardiovascular:  unremarkable   Respiratory:unremarkable       as per HPI Genitourinary:unremarkable     Musculoskeletal:unremarkable   Skin:unremarkable Breast:unremarkable   Hematolgic/Lymphatic:unremarkable     Allergic/Immunologic:unremarkable     Past Medical History:  Obtained     Past Medical History  Surgical History: none Medical Problems: none Psychiatric History: none Allergies: NKDA Medications: none   Social History:Obtained  Social History  Preferred Language: English Race:  Black or African American Ethnicity: Not Hispanic / Latino Age: 72 Years 2 Months Marital Status:  S Alcohol: none   Smoking Status: Never smoker reviewed on 05/16/2013 Functional Status reviewed on mm/dd/yyyy ------------------------------------------------ Bathing: Normal Cooking: Normal Dressing: Normal Driving: Normal Eating: Normal Managing Meds: Normal Oral Care: Normal Shopping: Normal Toileting: Normal Transferring: Normal Walking: Normal Cognitive Status reviewed on mm/dd/yyyy ------------------------------------------------ Attention: Normal Decision Making: Normal Language: Normal Memory: Normal Motor: Normal Perception: Normal Problem Solving: Normal Visual and Spatial: Normal   Family  History:Obtained    Family Health History Mother  Father  Other Family Member, Living; Healthy; noncontributory    Objective Information: General:  Well appearing, well nourished in no distress. Skin:     no rash or prominent lesions Head:Atraumatic; no masses; no abnormalities Eyes:  conjunctiva clear, EOM intact, PERRL Mouth:  Mucous membranes moist, no mucosal lesions. Neck:  Supple without lymphadenopathy.  Heart:  RRR, no murmur Lungs:    CTA bilaterally, no wheezes, rhonchi, rales.  Breathing unlabored. Abdomen:Soft, NT/ND, no HSM, no masses.    prominent hemorrhoids, 2 columns.    Assessment:    Plan: Hemorrhoidal disease, external.  Medically refractomy.  Options discussed with patient.  Will schedule at his convenience.    Patient Education:Alternative treatments to surgery were discussed with patient (and family).  Risks and benefits  of procedure were fully explained to the patient (and family) who gave informed consent. Patient/family questions were addressed.  Follow-up:Pending Surgery

## 2013-05-17 NOTE — Interval H&P Note (Signed)
History and Physical Interval Note:  05/17/2013 10:39 AM  Chris Taylor  has presented today for surgery, with the diagnosis of external hemorrhoid  The various methods of treatment have been discussed with the patient and family. After consideration of risks, benefits and other options for treatment, the patient has consented to  Procedure(s): HEMORRHOIDECTOMY (N/A) as a surgical intervention .  The patient's history has been reviewed, patient examined, no change in status, stable for surgery.  I have reviewed the patient's chart and labs.  Questions were answered to the patient's satisfaction.     Piero Mustard C

## 2013-05-17 NOTE — Transfer of Care (Signed)
Immediate Anesthesia Transfer of Care Note  Patient: Chris Taylor  Procedure(s) Performed: Procedure(s): HEMORRHOIDECTOMY (N/A)  Patient Location: PACU  Anesthesia Type:General  Level of Consciousness: awake, alert , oriented and patient cooperative  Airway & Oxygen Therapy: Patient Spontanous Breathing and Patient connected to face mask oxygen  Post-op Assessment: Report given to PACU RN and Post -op Vital signs reviewed and stable  Post vital signs: Reviewed and stable  Complications: No apparent anesthesia complications

## 2013-05-17 NOTE — Progress Notes (Signed)
Rectal packing d/c'd. No active drainage. Tolerated well.

## 2013-05-19 ENCOUNTER — Encounter (HOSPITAL_COMMUNITY): Payer: Self-pay | Admitting: General Surgery

## 2013-10-16 ENCOUNTER — Encounter: Payer: Self-pay | Admitting: *Deleted

## 2013-10-18 ENCOUNTER — Encounter: Payer: Self-pay | Admitting: Internal Medicine

## 2013-10-19 ENCOUNTER — Encounter: Payer: Self-pay | Admitting: Internal Medicine

## 2013-10-19 ENCOUNTER — Ambulatory Visit (INDEPENDENT_AMBULATORY_CARE_PROVIDER_SITE_OTHER): Payer: Medicare Other | Admitting: Internal Medicine

## 2013-10-19 VITALS — BP 138/74 | HR 66 | Ht 69.0 in | Wt 184.0 lb

## 2013-10-19 DIAGNOSIS — R9431 Abnormal electrocardiogram [ECG] [EKG]: Secondary | ICD-10-CM

## 2013-10-19 DIAGNOSIS — Z8249 Family history of ischemic heart disease and other diseases of the circulatory system: Secondary | ICD-10-CM

## 2013-10-19 DIAGNOSIS — Z13220 Encounter for screening for lipoid disorders: Secondary | ICD-10-CM

## 2013-10-19 NOTE — Patient Instructions (Signed)
Please have blood work done today. We will call you with the results.  Your physician wants you to follow-up in: 1 year. You will receive a reminder letter in the mail two months in advance. If you don't receive a letter, please call our office to schedule the follow-up appointment.  

## 2013-10-19 NOTE — Progress Notes (Signed)
OFFICE NOTE  Chief Complaint:  Routine follow-up  Primary Care Physician: Colette Ribas, MD  HPI:  Chris Taylor  is a 72 year old gentleman with a history of abnormal EKG and family history of coronary disease. He had a stress test in the past which was negative, and he has been asymptomatic since that time. He is here basically for ongoing followup and risk factor modification. He denies any chest pain, shortness of breath, palpitations, presyncope or syncopal symptoms. He actually maintains physical activity, including doing regular exercises including pushups every day. He does have a strong family history of coronary disease with his father dying of a heart attack at age 53. His mother had heart attack at age 16 and 2 brothers died of heart attacks both at age 77 and 73. Another brother died of stroke at age 23. [name] continues to do well however in case care of himself. He is a nonsmoker and eats a fairly healthy diet and regularly exercises. He is interested in ongoing screening for coronary disease.  PMHx:  Past Medical History  Diagnosis Date  . Hemorrhoids   . Sleep apnea   . Forgetfulness   . Family history of heart disease   . History of nuclear stress test 1996    negative    Past Surgical History  Procedure Laterality Date  . Cataract extraction, bilateral    . Knee arthroscopy Right   . Hemorrhoid surgery N/A 05/17/2013    Procedure: HEMORRHOIDECTOMY;  Surgeon: Fabio Bering, MD;  Location: [facility];  Service: General;  Laterality: N/A;    FAMHx:  Family History  Problem Relation Age of Onset  . Heart disease Mother     MI @ 24, stroke  . Heart disease Father     MI @ 66, CABG  . Heart attack Brother   . Heart attack Brother   . Stroke Brother     SOCHx:   reports that he quit smoking about 30 years ago. He has never used smokeless tobacco. He reports that he does not drink alcohol or use illicit drugs.  ALLERGIES:  No Known  Allergies  ROS: A comprehensive review of systems was negative.  HOME MEDS: Current Outpatient Prescriptions  Medication Sig Dispense Refill  . aspirin 81 MG tablet Take 81 mg by mouth daily.      . calcium carbonate 200 MG capsule Take 200 mg by mouth daily.      . Cholecalciferol (VITAMIN D PO) Take by mouth daily.      Marland Kitchen COENZYME Q-10 PO Take by mouth daily.      . Ginkgo 60 MG TABS Take 60 mg by mouth daily.      . memantine (NAMENDA) 5 MG tablet Take 5 mg by mouth daily.      . Multiple Vitamin (MULTIVITAMIN) capsule Take 1 capsule by mouth daily.      Marland Kitchen NIACIN CR PO Take by mouth daily.       No current facility-administered medications for this visit.    LABS/IMAGING: No results found for this or any previous visit (from the past 48 hour(s)). No results found.  VITALS: BP 138/74  Pulse 66  Ht 5\' 9"  (1.753 m)  Wt 184 lb (83.462 kg)  BMI 27.16 kg/m2  EXAM: General appearance: alert and no distress Neck: no carotid bruit and no JVD Lungs: clear to auscultation bilaterally Heart: regular rate and rhythm, S1, S2 normal, no murmur, click, rub or gallop Abdomen: soft,  non-tender; bowel sounds normal; no masses,  no organomegaly Extremities: extremities normal, atraumatic, no cyanosis or edema Pulses: 2+ and symmetric Skin: Skin color, texture, turgor normal. No rashes or lesions Neurologic: Grossly normal Psych: Some memory loss  EKG: Normal sinus rhythm at 66, mild left axis deviation  ASSESSMENT: 1. Abnormal EKG-asymptomatic 2. Family history premature coronary disease  PLAN: 1.   [name] is again requesting testing for any identifiable risk for coronary disease. I would recommend a lipid profile with NMR to evaluate additional potential risk for coronary disease. If this is quite elevated, we may consider coronary calcium scoring to help risk stratify him and determine how aggressive we need to be with lipid management. Given his age in the 50s, however is  would not be surprising for him to have some coronary calcium. We'll contact him with results of his lipid profile and treat as necessary. Plan to see him back annually.  Chrystie Nose, MD, [facility] Attending Cardiologist [facility]  Zeven Kocak C 10/19/2013, 9:59 AM

## 2013-10-20 LAB — NMR LIPOPROFILE WITH LIPIDS
Cholesterol, Total: 188 mg/dL (ref <200)
HDL Size: 9.7 nm (ref >=9.2)
LDL (calc): 108 mg/dL — ABNORMAL HIGH (ref <100)
LDL Particle Number: 1387 nmol/L — ABNORMAL HIGH (ref <1000)
LP-IR Score: <25 (ref <=45)
Large VLDL-P: <0.8 nmol/L (ref <=2.7)
Small LDL Particle Number: 386 nmol/L (ref <=527)
Triglycerides: 67 mg/dL (ref <150)
VLDL Size: 38.2 nm (ref <=46.6)

## 2013-10-26 ENCOUNTER — Telehealth: Payer: Self-pay | Admitting: *Deleted

## 2013-10-26 DIAGNOSIS — E785 Hyperlipidemia, unspecified: Secondary | ICD-10-CM

## 2013-10-26 MED ORDER — ATORVASTATIN CALCIUM 20 MG PO TABS: 20.0000 mg | ORAL_TABLET | Freq: Every day | ORAL | Status: DC

## 2013-10-26 NOTE — Telephone Encounter (Signed)
Message copied by Lindell Spar on Tue Oct 26, 2013  8:10 AM ------      Message from: Chrystie Nose      Created: Fri Oct 22, 2013  2:10 PM       Please inform him that the cholesterol is mildly elevated (want particle number <1000, LDL<70 given strong family history of CAD).  I would recommend starting atorvastatin 20 mg daily.  Re-check lipid NMR in 3 months.            -Dr. Rennis Golden ------

## 2013-10-26 NOTE — Telephone Encounter (Signed)
Called patient to inform of NMR (cholesterol results). Informed of need for atorvastatin 20mg  daily and repeat blood work in 3 months. Patient agreed with plan. Medication & lab ordered. Lab slips mailed to patient.

## 2017-01-15 ENCOUNTER — Other Ambulatory Visit: Payer: Self-pay | Admitting: General Surgery

## 2017-01-15 ENCOUNTER — Ambulatory Visit (INDEPENDENT_AMBULATORY_CARE_PROVIDER_SITE_OTHER): Payer: Self-pay | Admitting: General Surgery

## 2017-01-28 ENCOUNTER — Encounter (HOSPITAL_COMMUNITY): Payer: Self-pay

## 2017-01-28 NOTE — Pre-Procedure Instructions (Signed)
Chris Taylor  01/28/2017      [facility] [phone] - [place], [place] - [address] AT [address] [place] [id number] Phone: [phone] Fax: [phone]    Your procedure is scheduled on Wednesday February 21.  [facility] at 10:30 A.M.  Call this number if you have problems the morning of surgery:  [phone]   Remember:  Do not eat food or drink liquids after midnight.  Take these medicines the morning of surgery with A SIP OF WATER: namenda XR  7 days prior to surgery STOP taking any Aspirin, Aleve, Naproxen, Ibuprofen, Motrin, Advil, Goody'Taylor, BC'Taylor, all herbal medications, fish oil, and all vitamins    Do not wear jewelry, make-up or nail polish.  Do not wear lotions, powders, or perfumes, or deoderant.  Do not shave 48 hours prior to surgery.  Men may shave face and neck.  Do not bring valuables to the hospital.  [facility] is not responsible for any belongings or valuables.  Contacts, dentures or bridgework may not be worn into surgery.  Leave your suitcase in the car.  After surgery it may be brought to your room.  For patients admitted to the hospital, discharge time will be determined by your treatment team.  Patients discharged the day of surgery will not be allowed to drive home.    Special instructions:    [facility]- Preparing For Surgery  Before surgery, you can play an important role. Because skin is not sterile, your skin needs to be as free of germs as possible. You can reduce the number of germs on your skin by washing with CHG (chlorahexidine gluconate) Soap before surgery.  CHG is an antiseptic cleaner which kills germs and bonds with the skin to continue killing germs even after washing.  Please do not use if you have an allergy to CHG or antibacterial soaps. If your skin becomes reddened/irritated stop using the CHG.  Do not shave (including legs and  underarms) for at least 48 hours prior to first CHG shower. It is OK to shave your face.  Please follow these instructions carefully.   1. Shower the NIGHT BEFORE SURGERY and the MORNING OF SURGERY with CHG.   2. If you chose to wash your hair, wash your hair first as usual with your normal shampoo.  3. After you shampoo, rinse your hair and body thoroughly to remove the shampoo.  4. Use CHG as you would any other liquid soap. You can apply CHG directly to the skin and wash gently with a scrungie or a clean washcloth.   5. Apply the CHG Soap to your body ONLY FROM THE NECK DOWN.  Do not use on open wounds or open sores. Avoid contact with your eyes, ears, mouth and genitals (private parts). Wash genitals (private parts) with your normal soap.  6. Wash thoroughly, paying special attention to the area where your surgery will be performed.  7. Thoroughly rinse your body with warm water from the neck down.  8. DO NOT shower/wash with your normal soap after using and rinsing off the CHG Soap.  9. Pat yourself dry with a CLEAN TOWEL.   10. Wear CLEAN PAJAMAS   11. Place CLEAN SHEETS on your bed the night of your first shower and DO NOT SLEEP WITH PETS.    Day of Surgery: Do not apply any deodorants/lotions. Please wear  clean clothes to the hospital/surgery [facility].

## 2017-01-29 ENCOUNTER — Encounter (HOSPITAL_COMMUNITY): Payer: Self-pay

## 2017-01-29 ENCOUNTER — Encounter (HOSPITAL_COMMUNITY)
Admission: RE | Admit: 2017-01-29 | Discharge: 2017-01-29 | Disposition: A | Discharge status: Home / Self Care | Payer: Medicare Other | Source: Ambulatory Visit | Attending: General Surgery | Admitting: General Surgery

## 2017-01-29 DIAGNOSIS — Z87891 Personal history of nicotine dependence: Secondary | ICD-10-CM | POA: Diagnosis not present

## 2017-01-29 DIAGNOSIS — K4090 Unilateral inguinal hernia, without obstruction or gangrene, not specified as recurrent: Secondary | ICD-10-CM | POA: Insufficient documentation

## 2017-01-29 DIAGNOSIS — Z01812 Encounter for preprocedural laboratory examination: Secondary | ICD-10-CM | POA: Diagnosis present

## 2017-01-29 DIAGNOSIS — M1990 Unspecified osteoarthritis, unspecified site: Secondary | ICD-10-CM | POA: Insufficient documentation

## 2017-01-29 DIAGNOSIS — G4733 Obstructive sleep apnea (adult) (pediatric): Secondary | ICD-10-CM | POA: Insufficient documentation

## 2017-01-29 DIAGNOSIS — Z01810 Encounter for preprocedural cardiovascular examination: Secondary | ICD-10-CM | POA: Insufficient documentation

## 2017-01-29 HISTORY — DX: Unspecified osteoarthritis, unspecified site: M19.90

## 2017-01-29 LAB — CBC
HCT: 46 % (ref 39.0–52.0)
HEMOGLOBIN: 15.1 g/dL (ref 13.0–17.0)
MCH: 31.2 pg (ref 26.0–34.0)
MCHC: 32.8 g/dL (ref 30.0–36.0)
MCV: 95 fL (ref 78.0–100.0)
PLATELETS: 167 10*3/uL (ref 150–400)
RBC: 4.84 MIL/uL (ref 4.22–5.81)
RDW: 14.2 % (ref 11.5–15.5)
WBC: 3.7 10*3/uL — AB (ref 4.0–10.5)

## 2017-01-29 NOTE — Progress Notes (Signed)
PCP: Dr. Assunta FoundJohn Taylor

## 2017-01-31 NOTE — Progress Notes (Signed)
Anesthesia chart review: Patient is a 76 year old male scheduled for repair left inguinal hernia with mesh on 02/05/2017 by Dr. Violeta GelinasBurke Thompson.  History includes former smoker, forgetfulness, OSA (with CPAP use), arthritis, family history of heart disease.  PCP is Dr. Assunta FoundJohn Golding. He is not followed routinely by cardiologist but saw Dr. Zoila ShutterKenneth Hilty during 09/2011-10/2013 for testing for identifiable risk for CAD given his family history. He recommended a lipid profile with NMR, and if "quite elevated" would consider coronary calcium scoring. However, cholesterol only came back mildly elevated. Atorvastatin was recommended, but it appears patient is no longer taking (patietn reported that Dr. Phillips OdorGolding took him off of it). (In reviewing cardiology records scanned under the Media tab, patient had a previously normal stress test in 2004 per Dr. Elsie LincolnGamble for evaluation of abnormal EKG. He later want back to see cardiologist Dr. Rennis GoldenHilty starting in 2012 by his own choice due to family history of CAD, however, Dr. Rennis GoldenHilty did not think he warranted stress testing at that time.)   Meds include aspirin 81 mg, cod liver oil, coenzyme Q, garlic, ginkgo, Namenda, zinc gluconate.  EKG 01/29/17: NSR, LAD. LAD new since 05/14/13 tracing, but was present on the 10/19/13 tracing from when he was evaluated by Dr. Rennis GoldenHilty. Patient denied CP, SOB, edema, syncope. He is able to do yard work (including push and Education administratorriding lawn mower), wash his car, and vacuum his house without CV symptoms. He denied history of DM or known personal history of CAD/MI.  Preoperative CBC noted.   I think patient's EKG is overall stable. His activity is > 4 METS. If no acute changes then I anticipate that he can undergo inguinal hernia repair.  Velna Ochsllison Desiree Fleming, PA-C [facility]iology Phone [phone] 01/31/2017 1:39 PM

## 2017-02-05 ENCOUNTER — Observation Stay (HOSPITAL_COMMUNITY)
Admission: RE | Admit: 2017-02-05 | Discharge: 2017-02-06 | Disposition: A | Discharge status: Home / Self Care | Payer: Medicare Other | Source: Ambulatory Visit | Attending: General Surgery | Admitting: General Surgery

## 2017-02-05 ENCOUNTER — Ambulatory Visit (HOSPITAL_COMMUNITY): Payer: Medicare Other | Admitting: Certified Registered Nurse Anesthetist

## 2017-02-05 ENCOUNTER — Encounter (HOSPITAL_COMMUNITY): Payer: Self-pay | Admitting: Certified Registered Nurse Anesthetist

## 2017-02-05 ENCOUNTER — Encounter (HOSPITAL_COMMUNITY)
Admission: RE | Disposition: A | Discharge status: Home / Self Care | Payer: Self-pay | Source: Ambulatory Visit | Attending: General Surgery

## 2017-02-05 ENCOUNTER — Ambulatory Visit (HOSPITAL_COMMUNITY): Payer: Medicare Other | Admitting: Vascular Surgery

## 2017-02-05 DIAGNOSIS — Z87891 Personal history of nicotine dependence: Secondary | ICD-10-CM | POA: Insufficient documentation

## 2017-02-05 DIAGNOSIS — Z7982 Long term (current) use of aspirin: Secondary | ICD-10-CM | POA: Insufficient documentation

## 2017-02-05 DIAGNOSIS — G4730 Sleep apnea, unspecified: Secondary | ICD-10-CM | POA: Insufficient documentation

## 2017-02-05 DIAGNOSIS — Z8719 Personal history of other diseases of the digestive system: Secondary | ICD-10-CM

## 2017-02-05 DIAGNOSIS — K4090 Unilateral inguinal hernia, without obstruction or gangrene, not specified as recurrent: Secondary | ICD-10-CM | POA: Diagnosis not present

## 2017-02-05 DIAGNOSIS — Z9889 Other specified postprocedural states: Secondary | ICD-10-CM

## 2017-02-05 HISTORY — PX: INSERTION OF MESH: SHX5868

## 2017-02-05 HISTORY — PX: INGUINAL HERNIA REPAIR: SHX194

## 2017-02-05 LAB — CREATININE, SERUM
Creatinine, Ser: 1 mg/dL (ref 0.61–1.24)
GFR calc Af Amer: >60 mL/min (ref >60)

## 2017-02-05 SURGERY — REPAIR, HERNIA, INGUINAL, ADULT
Anesthesia: Regional | Site: Groin | Laterality: Left

## 2017-02-05 MED ORDER — PHENYLEPHRINE 40 MCG/ML (10ML) SYRINGE FOR IV PUSH (FOR BLOOD PRESSURE SUPPORT)
PREFILLED_SYRINGE | INTRAVENOUS | Status: AC
  Filled 2017-02-05: qty 10

## 2017-02-05 MED ORDER — EPHEDRINE 5 MG/ML INJ
INTRAVENOUS | Status: AC
  Filled 2017-02-05: qty 10

## 2017-02-05 MED ORDER — PANTOPRAZOLE SODIUM 40 MG IV SOLR
40.0000 mg | Freq: Every day | INTRAVENOUS | Status: DC
  Administered 2017-02-05: 40 mg via INTRAVENOUS
  Filled 2017-02-05: qty 40

## 2017-02-05 MED ORDER — CEFAZOLIN SODIUM-DEXTROSE 2-4 GM/100ML-% IV SOLN
INTRAVENOUS | Status: AC
  Filled 2017-02-05: qty 100

## 2017-02-05 MED ORDER — ONDANSETRON HCL 4 MG/2ML IJ SOLN
INTRAMUSCULAR | Status: DC | PRN
  Administered 2017-02-05: 4 mg via INTRAVENOUS

## 2017-02-05 MED ORDER — DOCUSATE SODIUM 100 MG PO CAPS
100.0000 mg | ORAL_CAPSULE | Freq: Two times a day (BID) | ORAL | Status: DC
  Administered 2017-02-05 – 2017-02-06 (×2): 100 mg via ORAL
  Filled 2017-02-05 (×3): qty 1

## 2017-02-05 MED ORDER — FENTANYL CITRATE (PF) 100 MCG/2ML IJ SOLN
75.0000 ug | Freq: Once | INTRAMUSCULAR | Status: AC
Start: 2017-02-05 — End: 2017-02-05
  Administered 2017-02-05: 75 ug via INTRAVENOUS
  Filled 2017-02-05: qty 1.5

## 2017-02-05 MED ORDER — LIDOCAINE 2% (20 MG/ML) 5 ML SYRINGE
INTRAMUSCULAR | Status: AC
  Filled 2017-02-05: qty 20

## 2017-02-05 MED ORDER — MEMANTINE HCL ER 7 MG PO CP24
7.0000 mg | ORAL_CAPSULE | Freq: Every day | ORAL | Status: DC
  Administered 2017-02-06: 7 mg via ORAL
  Filled 2017-02-05: qty 1

## 2017-02-05 MED ORDER — FENTANYL CITRATE (PF) 100 MCG/2ML IJ SOLN: 25.0000 ug | INTRAMUSCULAR | Status: DC | PRN

## 2017-02-05 MED ORDER — LIDOCAINE HCL (CARDIAC) 20 MG/ML IV SOLN
INTRAVENOUS | Status: DC | PRN
  Administered 2017-02-05: 50 mg via INTRAVENOUS

## 2017-02-05 MED ORDER — ACETAMINOPHEN 650 MG RE SUPP: 650.0000 mg | Freq: Four times a day (QID) | RECTAL | Status: DC | PRN

## 2017-02-05 MED ORDER — CHLORHEXIDINE GLUCONATE CLOTH 2 % EX PADS: 6.0000 | MEDICATED_PAD | Freq: Once | CUTANEOUS | Status: DC

## 2017-02-05 MED ORDER — DIPHENHYDRAMINE HCL 50 MG/ML IJ SOLN: 12.5000 mg | Freq: Four times a day (QID) | INTRAMUSCULAR | Status: DC | PRN

## 2017-02-05 MED ORDER — EPHEDRINE SULFATE 50 MG/ML IJ SOLN
INTRAMUSCULAR | Status: DC | PRN
  Administered 2017-02-05: 5 mg via INTRAVENOUS

## 2017-02-05 MED ORDER — LACTATED RINGERS IV SOLN
INTRAVENOUS | Status: DC
  Administered 2017-02-05: 10:00:00 via INTRAVENOUS

## 2017-02-05 MED ORDER — METHOCARBAMOL 500 MG PO TABS: 500.0000 mg | ORAL_TABLET | Freq: Four times a day (QID) | ORAL | Status: DC | PRN

## 2017-02-05 MED ORDER — HYDRALAZINE HCL 20 MG/ML IJ SOLN: 10.0000 mg | INTRAMUSCULAR | Status: DC | PRN

## 2017-02-05 MED ORDER — ENOXAPARIN SODIUM 40 MG/0.4ML ~~LOC~~ SOLN
40.0000 mg | SUBCUTANEOUS | Status: DC
  Administered 2017-02-06: 40 mg via SUBCUTANEOUS
  Filled 2017-02-05 (×2): qty 0.4

## 2017-02-05 MED ORDER — SUCCINYLCHOLINE CHLORIDE 200 MG/10ML IV SOSY
PREFILLED_SYRINGE | INTRAVENOUS | Status: AC
  Filled 2017-02-05: qty 20

## 2017-02-05 MED ORDER — ONDANSETRON HCL 4 MG/2ML IJ SOLN: 4.0000 mg | Freq: Four times a day (QID) | INTRAMUSCULAR | Status: DC | PRN

## 2017-02-05 MED ORDER — ZOLPIDEM TARTRATE 5 MG PO TABS: 5.0000 mg | ORAL_TABLET | Freq: Every evening | ORAL | Status: DC | PRN

## 2017-02-05 MED ORDER — BUPIVACAINE HCL (PF) 0.25 % IJ SOLN
INTRAMUSCULAR | Status: AC
  Filled 2017-02-05: qty 30

## 2017-02-05 MED ORDER — MORPHINE SULFATE (PF) 2 MG/ML IV SOLN
2.0000 mg | INTRAVENOUS | Status: DC | PRN
  Administered 2017-02-05: 2 mg via INTRAVENOUS
  Filled 2017-02-05: qty 1

## 2017-02-05 MED ORDER — FENTANYL CITRATE (PF) 100 MCG/2ML IJ SOLN
INTRAMUSCULAR | Status: AC
  Filled 2017-02-05: qty 2

## 2017-02-05 MED ORDER — DIPHENHYDRAMINE HCL 12.5 MG/5ML PO ELIX
12.5000 mg | ORAL_SOLUTION | Freq: Four times a day (QID) | ORAL | Status: DC | PRN
Start: 2017-02-05 — End: 2017-02-06

## 2017-02-05 MED ORDER — MIDAZOLAM HCL 2 MG/2ML IJ SOLN
2.0000 mg | Freq: Once | INTRAMUSCULAR | Status: AC
  Administered 2017-02-05: 2 mg via INTRAVENOUS
  Filled 2017-02-05: qty 2

## 2017-02-05 MED ORDER — ACETAMINOPHEN 325 MG PO TABS
650.0000 mg | ORAL_TABLET | Freq: Four times a day (QID) | ORAL | Status: DC | PRN
  Administered 2017-02-06: 650 mg via ORAL
  Filled 2017-02-05: qty 2

## 2017-02-05 MED ORDER — FENTANYL CITRATE (PF) 100 MCG/2ML IJ SOLN
INTRAMUSCULAR | Status: DC | PRN
  Administered 2017-02-05 (×2): 50 ug via INTRAVENOUS

## 2017-02-05 MED ORDER — FENTANYL CITRATE (PF) 100 MCG/2ML IJ SOLN
INTRAMUSCULAR | Status: AC
  Administered 2017-02-05: 75 ug via INTRAVENOUS
  Filled 2017-02-05: qty 2

## 2017-02-05 MED ORDER — PROPOFOL 10 MG/ML IV BOLUS
INTRAVENOUS | Status: DC | PRN
  Administered 2017-02-05: 200 mg via INTRAVENOUS

## 2017-02-05 MED ORDER — TRAMADOL HCL 50 MG PO TABS
50.0000 mg | ORAL_TABLET | Freq: Four times a day (QID) | ORAL | Status: DC | PRN
  Administered 2017-02-05: 50 mg via ORAL
  Filled 2017-02-05: qty 1

## 2017-02-05 MED ORDER — SUCCINYLCHOLINE CHLORIDE 20 MG/ML IJ SOLN
INTRAMUSCULAR | Status: DC | PRN
  Administered 2017-02-05: 130 mg via INTRAVENOUS

## 2017-02-05 MED ORDER — PROPOFOL 10 MG/ML IV BOLUS
INTRAVENOUS | Status: AC
  Filled 2017-02-05: qty 20

## 2017-02-05 MED ORDER — ONDANSETRON 4 MG PO TBDP: 4.0000 mg | ORAL_TABLET | Freq: Four times a day (QID) | ORAL | Status: DC | PRN

## 2017-02-05 MED ORDER — CEFAZOLIN SODIUM-DEXTROSE 2-4 GM/100ML-% IV SOLN
2.0000 g | INTRAVENOUS | Status: AC
  Administered 2017-02-05: 2 g via INTRAVENOUS

## 2017-02-05 MED ORDER — MIDAZOLAM HCL 2 MG/2ML IJ SOLN
INTRAMUSCULAR | Status: AC
  Administered 2017-02-05: 2 mg via INTRAVENOUS
  Filled 2017-02-05: qty 2

## 2017-02-05 MED ORDER — 0.9 % SODIUM CHLORIDE (POUR BTL) OPTIME
TOPICAL | Status: DC | PRN
  Administered 2017-02-05: 1000 mL

## 2017-02-05 MED ORDER — BUPIVACAINE HCL (PF) 0.25 % IJ SOLN
INTRAMUSCULAR | Status: DC | PRN
  Administered 2017-02-05: 10 mL

## 2017-02-05 MED ORDER — ONDANSETRON HCL 4 MG/2ML IJ SOLN
INTRAMUSCULAR | Status: AC
  Filled 2017-02-05: qty 4

## 2017-02-05 SURGICAL SUPPLY — 48 items
ADH SKN CLS APL DERMABOND .7 (GAUZE/BANDAGES/DRESSINGS) ×1
BLADE SURG 10 STRL SS (BLADE) ×3 IMPLANT
BLADE SURG 15 STRL LF DISP TIS (BLADE) ×1 IMPLANT
BLADE SURG 15 STRL SS (BLADE) ×3
BLADE SURG ROTATE 9660 (MISCELLANEOUS) IMPLANT
CANISTER SUCTION 2500CC (MISCELLANEOUS) ×3 IMPLANT
CHLORAPREP W/TINT 26ML (MISCELLANEOUS) ×3 IMPLANT
COVER SURGICAL LIGHT HANDLE (MISCELLANEOUS) ×3 IMPLANT
DERMABOND ADVANCED (GAUZE/BANDAGES/DRESSINGS) ×2
DERMABOND ADVANCED .7 DNX12 (GAUZE/BANDAGES/DRESSINGS) ×1 IMPLANT
DRAIN PENROSE 1/2X12 LTX STRL (WOUND CARE) IMPLANT
DRAPE LAPAROTOMY 100X72 PEDS (DRAPES) ×3 IMPLANT
DRAPE UTILITY XL STRL (DRAPES) ×6 IMPLANT
ELECT CAUTERY BLADE 6.4 (BLADE) ×3 IMPLANT
ELECT REM PT RETURN 9FT ADLT (ELECTROSURGICAL) ×3
ELECTRODE REM PT RTRN 9FT ADLT (ELECTROSURGICAL) ×1 IMPLANT
GLOVE BIO SURGEON STRL SZ8 (GLOVE) ×3 IMPLANT
GLOVE BIOGEL PI IND STRL 8 (GLOVE) ×1 IMPLANT
GLOVE BIOGEL PI INDICATOR 8 (GLOVE) ×2
GOWN STRL REUS W/ TWL LRG LVL3 (GOWN DISPOSABLE) ×1 IMPLANT
GOWN STRL REUS W/ TWL XL LVL3 (GOWN DISPOSABLE) ×1 IMPLANT
GOWN STRL REUS W/TWL LRG LVL3 (GOWN DISPOSABLE) ×3
GOWN STRL REUS W/TWL XL LVL3 (GOWN DISPOSABLE) ×3
KIT BASIN OR (CUSTOM PROCEDURE TRAY) ×3 IMPLANT
KIT ROOM TURNOVER OR (KITS) ×3 IMPLANT
MESH HERNIA 3X6 (Mesh General) ×2 IMPLANT
NEEDLE 22X1 1/2 (OR ONLY) (NEEDLE) ×3 IMPLANT
NS IRRIG 1000ML POUR BTL (IV SOLUTION) ×3 IMPLANT
PACK SURGICAL SETUP 50X90 (CUSTOM PROCEDURE TRAY) ×3 IMPLANT
PAD ARMBOARD 7.5X6 YLW CONV (MISCELLANEOUS) ×3 IMPLANT
PENCIL BUTTON HOLSTER BLD 10FT (ELECTRODE) ×3 IMPLANT
PLUG ATRIUM AND PATCH X LRG (MISCELLANEOUS) IMPLANT
SPECIMEN JAR SMALL (MISCELLANEOUS) IMPLANT
SPONGE LAP 18X18 X RAY DECT (DISPOSABLE) ×3 IMPLANT
SUT MNCRL AB 4-0 PS2 18 (SUTURE) ×3 IMPLANT
SUT PROLENE 2 0 CT2 30 (SUTURE) ×9 IMPLANT
SUT VIC AB 2-0 SH 27 (SUTURE) ×3
SUT VIC AB 2-0 SH 27X BRD (SUTURE) ×1 IMPLANT
SUT VIC AB 3-0 SH 27 (SUTURE) ×3
SUT VIC AB 3-0 SH 27X BRD (SUTURE) ×1 IMPLANT
SUT VICRYL AB 3 0 TIES (SUTURE) IMPLANT
SYR BULB 3OZ (MISCELLANEOUS) ×3 IMPLANT
SYR CONTROL 10ML LL (SYRINGE) ×3 IMPLANT
TOWEL OR 17X24 6PK STRL BLUE (TOWEL DISPOSABLE) ×3 IMPLANT
TOWEL OR 17X26 10 PK STRL BLUE (TOWEL DISPOSABLE) ×3 IMPLANT
TUBE CONNECTING 12'X1/4 (SUCTIONS) ×1
TUBE CONNECTING 12X1/4 (SUCTIONS) ×2 IMPLANT
YANKAUER SUCT BULB TIP NO VENT (SUCTIONS) ×3 IMPLANT

## 2017-02-05 NOTE — Anesthesia Preprocedure Evaluation (Signed)
Anesthesia Evaluation  Patient identified by MRN, date of birth, ID band Patient awake  General Assessment Comment:History noted. CG  Reviewed: Allergy & Precautions, NPO status , Patient's Chart, lab work & pertinent test results  Airway Mallampati: II  TM Distance: >3 FB Neck ROM: Limited    Dental   Pulmonary sleep apnea , former smoker,    breath sounds clear to auscultation       Cardiovascular negative cardio ROS   Rhythm:Regular Rate:Normal     Neuro/Psych    GI/Hepatic negative GI ROS, Neg liver ROS,   Endo/Other  negative endocrine ROS  Renal/GU negative Renal ROS     Musculoskeletal  (+) Arthritis ,   Abdominal   Peds  Hematology   Anesthesia Other Findings   Reproductive/Obstetrics                             Anesthesia Physical Anesthesia Plan  ASA: II  Anesthesia Plan: General   Post-op Pain Management:  Regional for Post-op pain   Induction:   Airway Management Planned: LMA  Additional Equipment:   Intra-op Plan:   Post-operative Plan: Extubation in OR  Informed Consent: I have reviewed the patients History and Physical, chart, labs and discussed the procedure including the risks, benefits and alternatives for the proposed anesthesia with the patient or authorized representative who has indicated his/her understanding and acceptance.   Dental advisory given  Plan Discussed with: CRNA and Anesthesiologist  Anesthesia Plan Comments:         Anesthesia Quick Evaluation

## 2017-02-05 NOTE — Op Note (Signed)
02/05/2017  1:42 PM  PATIENT:  Chris [facility]  76 y.o. male  PRE-OPERATIVE DIAGNOSIS:  left inguinal hernia  POST-OPERATIVE DIAGNOSIS:  left inguinal hernia  PROCEDURE:  Procedure(s): REPAIR LEFT INGUINAL HERNIA INSERTION OF MESH  SURGEON:  Surgeon(s): Violeta GelinasBurke Willey Due, MD  ASSISTANTS: none   ANESTHESIA:   local, regional and general  EBL:  Total I/O In: -  Out: 25 [Blood:25]  BLOOD ADMINISTERED:none  DRAINS: none   SPECIMEN:  No Specimen  DISPOSITION OF SPECIMEN:  N/A  COUNTS:  YES  DICTATION: .Dragon Dictation Findings: Direct inguinal hernia  Procedure in detail: Marcy SalvoRaymond presents for left inguinal hernia repair with mesh. He was identified in the preop holding area. Informed consent was obtained. TAP block was placed by anesthesia. He received intravenous antibiotics. His site was marked. He was brought to the operating room and general endotracheal anesthesia was administered by the anesthesia staff. His lower abdomen and both groins were prepped and draped in sterile fashion. Time out procedure was performed. Local anesthetic was injected in the left inguinal region. Left inguinal incision was made. Subcutaneous tissues were dissected down through Scarpa's fascia revealing the external oblique fascia. This was attenuated medially due to the hernia. It was divided out laterally in the division was continued down through the ring medially. The superior leaflet was dissected free off the transversalis. Inferior leaflet was dissected down revealing the shelving edge of inguinal ligament. Cord structures were encircled with a Penrose drain. Dissection of the cord revealed no indirect inguinal hernia. There was a moderately large direct inguinal hernia. This reduced easily into the abdomen. He was kept in place temporarily with several interrupted 2-0 Vicryl sutures between the transversalis and the shelving edge of inguinal ligament. Next the hernia repair was completed with a  keyhole polypropylene mesh. This was tacked to the tissues over the pubic tubercle with interrupted 2-0 Prolene. It was sewn to the shelving edge of inguinal ligament inferiorly with running 2-0 Prolene. Multiple interrupted Prolenes were used to tack it down to the transversalis superiorly. The 2 leaflets of the mesh were rejoined behind the cord structure. They were then tacked together and to the underlying musculature with interrupted 2-0 Prolene. The mesh laid flat. Hemostasis was ensured. The area was irrigated. External oblique fascia was closed with running 2-0 Vicryl. Scarpa's fascia was closed with interrupted 3-0 Vicryl. Skin was closed with 4-0 Monocryl followed by Dermabond. All counts were correct. He tolerated the procedure well without apparent complication. His left testicle was returned to anatomic position in the scrotum at the completion of the procedure and it was taken recovery in stable condition. We plan to keep him overnight due to his use of CPAP mask and that he lives alone. PATIENT DISPOSITION:  PACU - hemodynamically stable.   Delay start of Pharmacological VTE agent (>24hrs) due to surgical blood loss or risk of bleeding:  no  Violeta GelinasBurke Zuzanna Maroney, MD, MPH, FACS Pager: [phone]  2/21/20181:42 PM

## 2017-02-05 NOTE — Anesthesia Procedure Notes (Signed)
Procedure Name: Intubation Date/Time: 02/05/2017 12:43 PM Performed by: Adonis HousekeeperNGELL, Orion Vandervort M Pre-anesthesia Checklist: Patient identified, Emergency Drugs available, Suction available and Patient being monitored Patient Re-evaluated:Patient Re-evaluated prior to inductionOxygen Delivery Method: Circle system utilized Preoxygenation: Pre-oxygenation with 100% oxygen Intubation Type: IV induction Ventilation: Mask ventilation without difficulty Laryngoscope Size: Glidescope and 4 Grade View: Grade I Tube type: Oral Tube size: 7.0 mm Number of attempts: 1 Airway Equipment and Method: Stylet Placement Confirmation: ETT inserted through vocal cords under direct vision,  positive ETCO2 and breath sounds checked- equal and bilateral Secured at: 22 cm Tube secured with: Tape Dental Injury: Teeth and Oropharynx as per pre-operative assessment  Comments: LMA #5 attempted but could not ventilate adequately

## 2017-02-05 NOTE — Anesthesia Procedure Notes (Signed)
Anesthesia Regional Block: TAP block   Pre-Anesthetic Checklist: ,, timeout performed, Correct Patient, Correct Site, Correct Laterality, Correct Procedure, Correct Position, site marked, Risks and benefits discussed,  Surgical consent,  Pre-op evaluation,  At surgeon's request and post-op pain management  Laterality: Left  Prep: chloraprep       Needles:   Needle Type: Stimulator Needle - 40          Additional Needles:   Procedures: Doppler guided, Ultrasound guided,,,,,,  Narrative:  Start time: 02/05/2017 11:05 AM End time: 02/05/2017 11:21 AM  Performed by: Personally  Anesthesiologist: Chris SinghGREEN, Chris [facility]

## 2017-02-05 NOTE — Anesthesia Postprocedure Evaluation (Signed)
Anesthesia Post Note  Patient: Chris Taylor  Procedure(s) Performed: Procedure(s) (LRB): REPAIR LEFT INGUINAL HERNIA (Left) INSERTION OF MESH (Left)  Patient location during evaluation: PACU Anesthesia Type: Regional and General Level of consciousness: awake Pain management: pain level controlled Respiratory status: spontaneous breathing Cardiovascular status: stable Anesthetic complications: no       Last Vitals:  Vitals:   02/05/17 1415 02/05/17 1437  BP:  124/77  Pulse: 63 62  Resp: (!) 21 20  Temp:      Last Pain:  Vitals:   02/05/17 1020  TempSrc: Oral                 [name]

## 2017-02-05 NOTE — H&P (Signed)
Chris Taylor 01/15/2017 8:53 AM Location: [facility] Patient #: [id number] DOB: 03/09/1941 Single / Language: [place] / Race: Black or African American Male   History of Present Illness Laurell Josephs(Xariah Silvernail E. Janee Mornhompson MD; 01/15/2017 9:22 AM) The patient is a 76 year old male who presents with an inguinal hernia. I was asked to see Chris Taylor in consultation by Dr. Wende CreaseGuarino in regards to a left inguinal hernia. He had not noticed the hernia previously. It was found on a DOT physical. Since then, he has noticed a bulge in his left groin. He remains asymptomatic. He has been holding it in when he strains or has a bowel movement in order for it not to get larger. No significant constipation. No problems passing his urine. 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   Past Surgical History Juanita Craver(Armen Glenn, CMA; 01/15/2017 8:53 AM) Hemorrhoidectomy  Knee Surgery  Bilateral.  Diagnostic Studies History Juanita Craver(Armen Glenn, CMA; 01/15/2017 8:53 AM) Colonoscopy  5-10 years ago  Allergies Juanita Craver(Armen Glenn, CMA; 01/15/2017 8:54 AM) No Known Drug Allergies 01/15/2017  Medication History Juanita Craver(Armen Glenn, CMA; 01/15/2017 8:56 AM) Candiss NorseNamenda XR (7MG  Capsule ER 24HR, Oral) Active. Aspirin (81MG  Tablet, Oral) Active. Cod Liver Oil ([id number]) (Oral) Active. Ginkoba (40MG  Tablet, Oral) Active. Niacin (100MG  Tablet, Oral) Active. Benadryl Allergy (25MG  Capsule, Oral) Active. Medications Reconciled  Social History Juanita Craver(Armen Glenn, CMA; 01/15/2017 8:53 AM) Alcohol use  Occasional alcohol use. Caffeine use  Coffee. No drug use  Tobacco use  Never smoker.  Family History Juanita Craver(Armen Glenn, CMA; 01/15/2017 8:53 AM) Cerebrovascular Accident  Brother, Mother. Colon Cancer  Brother. Diabetes Mellitus  Mother. Heart Disease  Father. Heart disease in male family member before age 455  Hypertension  Father.  Other Problems Juanita Craver(Armen Glenn, CMA;  01/15/2017 8:53 AM) Inguinal Hernia     Review of Systems (Armen Glenn CMA; 01/15/2017 8:53 AM) General Not Present- Appetite Loss, Chills, Fatigue, Fever, Night Sweats, Weight Gain and Weight Loss. Skin Not Present- Change in Wart/Mole, Dryness, Hives, Jaundice, New Lesions, Non-Healing Wounds, Rash and Ulcer. HEENT Not Present- Earache, Hearing Loss, Hoarseness, Nose Bleed, Oral Ulcers, Ringing in the Ears, Seasonal Allergies, Sinus Pain, Sore Throat, Visual Disturbances, Wears glasses/contact lenses and Yellow Eyes. Respiratory Present- Snoring. Not Present- Bloody sputum, Chronic Cough, Difficulty Breathing and Wheezing. Breast Not Present- Breast Mass, Breast Pain, Nipple Discharge and Skin Changes. Cardiovascular Not Present- Chest Pain, Difficulty Breathing Lying Down, Leg Cramps, Palpitations, Rapid Heart Rate, Shortness of Breath and Swelling of Extremities. Gastrointestinal Not Present- Abdominal Pain, Bloating, Bloody Stool, Change in Bowel Habits, Chronic diarrhea, Constipation, Difficulty Swallowing, Excessive gas, Gets full quickly at meals, Hemorrhoids, Indigestion, Nausea, Rectal Pain and Vomiting. Male Genitourinary Not Present- Blood in Urine, Change in Urinary Stream, Frequency, Impotence, Nocturia, Painful Urination, Urgency and Urine Leakage. Musculoskeletal Not Present- Back Pain, Joint Pain, Joint Stiffness, Muscle Pain, Muscle Weakness and Swelling of Extremities. Neurological Not Present- Decreased Memory, Fainting, Headaches, Numbness, Seizures, Tingling, Tremor, Trouble walking and Weakness. Psychiatric Not Present- Anxiety, Bipolar, Change in Sleep Pattern, Depression, Fearful and Frequent crying. Hematology Not Present- Blood Thinners, Easy Bruising, Excessive bleeding, Gland problems, HIV and Persistent Infections.  Vitals (Armen Glenn CMA; 01/15/2017 8:54 AM) 01/15/2017 8:54 AM Weight: 176.25 lb Height: 69in Body Surface Area: 1.96 m Body Mass Index: 26.03  kg/m  Temp.: 98.61F  Pulse: 89 (Regular)  P.OX: 98% (Room air) BP: 144/84 (Sitting, Left Arm, Standard)  Physical Exam Laurell Josephs E. Janee Morn MD; 01/15/2017 9:22 AM) General Mental Status-Alert. General Appearance-Consistent with stated age. Hydration-Well hydrated. Voice-Normal.  Head and Neck Head-normocephalic, atraumatic with no lesions or palpable masses. Trachea-midline. Thyroid Gland Characteristics - normal size and consistency.  Eye Eyeball - Bilateral-Extraocular movements intact. Sclera/Conjunctiva - Bilateral-No scleral icterus.  Chest and Lung Exam Chest and lung exam reveals -quiet, even and easy respiratory effort with no use of accessory muscles and on auscultation, normal breath sounds, no adventitious sounds and normal vocal resonance. Inspection Chest Wall - Normal. Back - normal.  Cardiovascular Cardiovascular examination reveals -normal heart sounds, regular rate and rhythm with no murmurs and normal pedal pulses bilaterally.  Abdomen Inspection Inspection of the abdomen reveals - No Hernias. Palpation/Percussion Palpation and Percussion of the abdomen reveal - Soft, Non Tender, No Rebound tenderness, No Rigidity (guarding) and No hepatosplenomegaly. Auscultation Auscultation of the abdomen reveals - Bowel sounds normal.  Male Genitourinary Note: No evidence of right inguinal hernia, both testes are descended and normal by palpation, moderate sized left inguinal hernia is present, this is easily reducible without pain and does not extend into his scrotum   Neurologic Neurologic evaluation reveals -alert and oriented x 3 with no impairment of recent or remote memory. Mental Status-Normal.  Musculoskeletal Global Assessment -Note: no gross deformities.  Normal Exam - Left-Upper Extremity Strength Normal and Lower Extremity Strength Normal. Normal Exam - Right-Upper Extremity Strength Normal and Lower  Extremity Strength Normal.  Lymphatic Head & Neck  General Head & Neck Lymphatics: Bilateral - Description - Normal. Axillary  General Axillary Region: Bilateral - Description - Normal. Tenderness - Non Tender. Femoral & Inguinal  Generalized Femoral & Inguinal Lymphatics: Bilateral - Description - No Generalized lymphadenopathy.    Assessment & Plan Laurell Josephs E. Janee Morn MD; 01/15/2017 9:24 AM) INGUINAL HERNIA OF LEFT SIDE WITHOUT OBSTRUCTION OR GANGRENE (K40.90) Impression: I have offered repair with mesh. The procedure, risks, and benefits were discussed in detail with him. He wears CPAP at night and does not have any family in the area. We will plan 23 hour admission after surgery. He reports that he went to the [facility] for skilled nursing rehabilitation after partial knee replacement in the past. I advised him this will likely not be necessary but we will have physical and occupational therapy evaluate him postoperatively. I answered his questions and he is agreeable. We look forward to scheduling in February. I discussed the expected postoperative course and lifting restrictionsfor post-op.

## 2017-02-05 NOTE — Addendum Note (Signed)
Addendum  created 02/05/17 1630 by Dorris Singhharlene Jilliane Kazanjian, MD   Image imported

## 2017-02-05 NOTE — Interval H&P Note (Signed)
History and Physical Interval Note:  02/05/2017 12:25 PM  Chris Taylor  has presented today for surgery, with the diagnosis of left inguinal hernia  The various methods of treatment have been discussed with the patient and family. After consideration of risks, benefits and other options for treatment, the patient has consented to  Procedure(s): REPAIR LEFT INGUINAL HERNIA (Left) INSERTION OF MESH (Left) as a surgical intervention .  The patient's history has been reviewed, patient examined, no change in status, stable for surgery.  I have reviewed the patient's chart and labs.  Questions were answered to the patient's satisfaction.     Yvonnie Schinke E

## 2017-02-05 NOTE — Transfer of Care (Signed)
Immediate Anesthesia Transfer of Care Note  Patient: Chris ModestRaymond E Dunphy  Procedure(s) Performed: Procedure(s): REPAIR LEFT INGUINAL HERNIA (Left) INSERTION OF MESH (Left)  Patient Location: PACU  Anesthesia Type:General and Regional  Level of Consciousness: awake, alert , oriented and patient cooperative  Airway & Oxygen Therapy: Patient Spontanous Breathing and Patient connected to nasal cannula oxygen  Post-op Assessment: Report given to RN and Post -op Vital signs reviewed and stable  Post vital signs: Reviewed and stable  Last Vitals:  Vitals:   02/05/17 1130 02/05/17 1349  BP: 125/66   Pulse: 78   Resp: (!) 21   Temp:  (P) 36.4 C    Last Pain:  Vitals:   02/05/17 1020  TempSrc: Oral         Complications: No apparent anesthesia complications

## 2017-02-06 ENCOUNTER — Encounter (HOSPITAL_COMMUNITY): Payer: Self-pay | Admitting: General Surgery

## 2017-02-06 DIAGNOSIS — K4090 Unilateral inguinal hernia, without obstruction or gangrene, not specified as recurrent: Secondary | ICD-10-CM | POA: Diagnosis not present

## 2017-02-06 LAB — CBC
HEMATOCRIT: 47.4 % (ref 39.0–52.0)
Hemoglobin: 16 g/dL (ref 13.0–17.0)
MCH: 32.5 pg (ref 26.0–34.0)
MCHC: 33.8 g/dL (ref 30.0–36.0)
MCV: 96.3 fL (ref 78.0–100.0)
Platelets: 173 10*3/uL (ref 150–400)
RBC: 4.92 MIL/uL (ref 4.22–5.81)
RDW: 14.3 % (ref 11.5–15.5)
WBC: 6.5 10*3/uL (ref 4.0–10.5)

## 2017-02-06 MED ORDER — DOCUSATE SODIUM 100 MG PO CAPS: 100.0000 mg | ORAL_CAPSULE | Freq: Two times a day (BID) | ORAL | 0 refills | Status: DC

## 2017-02-06 MED ORDER — TRAMADOL HCL 50 MG PO TABS: 50.0000 mg | ORAL_TABLET | Freq: Four times a day (QID) | ORAL | 0 refills | Status: DC | PRN

## 2017-02-06 NOTE — Discharge Summary (Signed)
Physician Discharge Summary  Patient ID: IZACK HOOGLAND MRN: [id number] DOB/AGE: 22-Oct-1941 76 y.o.  Admit date: 02/05/2017 Discharge date: 02/06/2017  Admission Diagnoses:LIH  Discharge Diagnoses: S/P repair LIH with mesh Active Problems:   S/P inguinal hernia repair   Discharged Condition: good  Hospital Course: Mr. Davoli underwent repair [facility] with mesh. He did well post op. He was observed overnight as he uses CPAP and lives alone. D/C this AM  Consults: None  Significant Diagnostic Studies: N/A  Treatments: surgery: above  Discharge Exam: Blood pressure 106/60, pulse 71, temperature 98.7 F (37.1 C), temperature source Oral, resp. rate 19, height 5\' 6"  (1.676 m), weight 81.5 kg (179 lb 10.8 oz), SpO2 98 %. General appearance: cooperative Resp: clear to auscultation bilaterally Cardio: regular rate and rhythm GI: soft, NT Incision/Wound:CDI  Disposition: 01-Home or Self Care  Discharge Instructions    Call MD for:  redness, tenderness, or signs of infection (pain, swelling, redness, odor or green/yellow discharge around incision site)    Complete by:  As directed    Call MD for:  severe uncontrolled pain    Complete by:  As directed    Diet - low sodium heart healthy    Complete by:  As directed    Discharge instructions    Complete by:  As directed    CC[facility] _______[facility]  UMBILICAL OR INGUINAL HERNIA REPAIR: POST OP INSTRUCTIONS  Always review your discharge instruction sheet given to you by the facility where your surgery was performed. IF YOU HAVE DISABILITY OR FAMILY LEAVE FORMS, YOU MUST BRING THEM TO THE OFFICE FOR PROCESSING.   DO NOT GIVE THEM TO YOUR DOCTOR.  1. A  prescription for pain medication may be given to you upon discharge.  Take your pain medication as prescribed, if needed.  If narcotic pain medicine is not needed, then you may take acetaminophen (Tylenol) or ibuprofen (Advil) as needed. 2. Take your usually prescribed  medications unless otherwise directed. If you need a refill on your pain medication, please contact your pharmacy.  They will contact our office to request authorization. Prescriptions will not be filled after 5 pm or on week-ends. 3. You should follow a light diet the first 24 hours after arrival home, such as soup and crackers, etc.  Be sure to include lots of fluids daily.  Resume your normal diet the day after surgery. 4.Most patients will experience some swelling and bruising around the umbilicus or in the groin and scrotum.  Ice packs and reclining will help.  Swelling and bruising can take several days to resolve.  6. It is common to experience some constipation if taking pain medication after surgery.  Increasing fluid intake and taking a stool softener (such as Colace) will usually help or prevent this problem from occurring.  A mild laxative (Milk of Magnesia or Miralax) should be taken according to package directions if there are no bowel movements after 48 hours. 7. Unless discharge instructions indicate otherwise, you may remove your bandages 24-48 hours after surgery, and you may shower at that time.  You may have steri-strips (small skin tapes) in place directly over the incision.  These strips should be left on the skin for 7-10 days.  If your surgeon used skin glue on the incision, you may shower in 24 hours.  The glue will flake off over the next 2-3 weeks.  Any sutures or staples will be removed at the office during your follow-up visit. 8. ACTIVITIES:  You  may resume regular (light) daily activities beginning the next day-such as daily self-care, walking, climbing stairs-gradually increasing activities as tolerated.  You may have sexual intercourse when it is comfortable.  Refrain from any heavy lifting or straining until approved by your doctor.  a.You may drive when you are no longer taking prescription pain medication, you can comfortably wear a seatbelt, and you can safely maneuver  your car and apply brakes. b.RETURN TO WORK:   _____________________________________________  9.You should see your doctor in the office for a follow-up appointment approximately 2-3 weeks after your surgery.  Make sure that you call for this appointment within a day or two after you arrive home to insure a convenient appointment time. 10.OTHER INSTRUCTIONS: _________________________    _____________________________________  WHEN TO CALL YOUR DOCTOR: Fever over 101.0 Inability to urinate Nausea and/or vomiting Extreme swelling or bruising Continued bleeding from incision. Increased pain, redness, or drainage from the incision  The clinic staff is available to answer your questions during regular business hours.  Please don't hesitate to call and ask to speak to one of the nurses for clinical concerns.  If you have a medical emergency, go to the nearest emergency room or call 911.  A surgeon from [facility] is always on call at the hospital   [address], [address], [place], [place]  [address] ?  [address] [address], [place], [place]   [id number] [phone] ? [phone] ? FAX [phone] Web site: [email or web address]   Increase activity slowly    Complete by:  As directed    No dressing needed    Complete by:  As directed      Allergies as of 02/06/2017      Reactions   No Known Allergies       Medication List    TAKE these medications   aspirin EC 81 MG tablet Take 81 mg by mouth daily.   Cod Liver Oil Oil Take 15 mLs by mouth daily.   CoQ10 200 MG Caps Take 200 mg by mouth 3 (three) times a week.   docusate sodium 100 MG capsule Commonly known as:  COLACE Take 1 capsule (100 mg total) by mouth 2 (two) times daily.   Garlic 1000 MG Caps Take 1,000 mg by mouth 3 (three) times a week.   Ginkgo 60 MG Tabs Take 60 mg by mouth 3 (three) times a week.   multivitamin with minerals Tabs tablet Take 1 tablet by mouth daily.   NAMENDA XR  7 MG Cp24 24 hr capsule Generic drug:  memantine Take 7 mg by mouth daily.   traMADol 50 MG tablet Commonly known as:  ULTRAM Take 1 tablet (50 mg total) by mouth every 6 (six) hours as needed for moderate pain or severe pain.   zinc gluconate 50 MG tablet Take 50 mg by mouth 3 (three) times a week.        SignedLiz Malady: Vincent Ehrler E 02/06/2017, 7:54 AM

## 2017-02-06 NOTE — Progress Notes (Signed)
Pt does not want to wear Cpap.   RN aware.

## 2017-05-14 ENCOUNTER — Ambulatory Visit: Payer: Medicare Other | Admitting: Family Medicine

## 2017-06-12 ENCOUNTER — Ambulatory Visit: Payer: Medicare Other | Admitting: Family Medicine

## 2017-10-23 ENCOUNTER — Ambulatory Visit: Payer: Medicare Other | Admitting: Family Medicine

## 2017-10-23 ENCOUNTER — Other Ambulatory Visit: Payer: Self-pay

## 2017-10-23 ENCOUNTER — Encounter: Payer: Self-pay | Admitting: Family Medicine

## 2017-10-23 VITALS — BP 130/76 | HR 72 | Temp 98.3°F | Resp 16 | Ht 69.0 in | Wt 182.0 lb

## 2017-10-23 DIAGNOSIS — Z23 Encounter for immunization: Secondary | ICD-10-CM

## 2017-10-23 DIAGNOSIS — Z8249 Family history of ischemic heart disease and other diseases of the circulatory system: Secondary | ICD-10-CM

## 2017-10-23 DIAGNOSIS — R413 Other amnesia: Secondary | ICD-10-CM

## 2017-10-23 DIAGNOSIS — N529 Male erectile dysfunction, unspecified: Secondary | ICD-10-CM

## 2017-10-23 MED ORDER — SILDENAFIL CITRATE 50 MG PO TABS: 50.0000 mg | ORAL_TABLET | Freq: Every day | ORAL | 0 refills | Status: DC | PRN

## 2017-10-23 MED ORDER — NAMENDA XR 7 MG PO CP24: 7.0000 mg | ORAL_CAPSULE | Freq: Every day | ORAL | 11 refills | Status: DC

## 2017-10-23 NOTE — Progress Notes (Signed)
Patient ID: Chris Taylor, male    DOB: 10/05/1941, 76 y.o.   MRN: [id number]  Chief Complaint  Patient presents with  . Memory Loss    needs refill of Namenda  . Erectile Dysfunction    needs refill of sildenafil    Allergies No known allergies  Subjective:   Chris ModestRaymond E Taylor is a 76 y.o. male who [facility].  HPI Here to establish care.  Previously seen at another office in town but reports switch to our office because he never got to see the doctor.  Reports he is very healthy and prides himself on taking care of his health.  Reports he exercises daily at the senior center.  Eats fruits, vegetables, and only bakes chicken.  Takes over-the-counter vitamins for his health.  He is interested to know if he could take a B12 supplement and if he should be taking an iron supplement.  Reports he has never had a heart attack, stroke, or TIA.  Was seen in preventive cardiology in the past and told he could take a cholesterol-lowering medication, but patient has not been on Lipitor.  Reports he feels great.  Comes in today because he would like a refill on his Viagra and a refill on his Namenda.  Reports that his previous primary care physician put him on the Namenda for his memory approximately 4 years ago.  Reports that he went through lab testing and evaluation at that time and was told he would benefit from the medication.  He is not sure if it has helped him or not.  He denies any problems with his memory other than he can occasionally be a little bit forgetful. Reports that his mood is good.  Energy level is good.  Reports that he sleeps from 8:30 PM to 5:30 AM each day.  Denies any nighttime awakenings.  Denies any chest pain, shortness of breath, or palpitations.  Reports his bowel movements are normal.  Denies any rectal bleeding or dark tarry stools.  Denies any weight loss or changes in appetite.  Reports that he does date occasionally.  Did use the  sildenifil at the 20 mg dose and it did not help.  Denies any pain with erections.  Denies any difficulty with urination.  Denies any penile discharge.  Chart notes that patient has a history of high blood pressure, however patient reports he has never had high blood pressure.  He did smoke for about 5 years but quit over 40 years ago.  Has not had any recent illnesses.  Reports no recent or remote hospitalizations.  Has been seen in orthopedics in the past for some knee issues.  Does not have any troubles with his exercise.     Past Medical History:  Diagnosis Date  . Arthritis   . Family history of heart disease   . Forgetfulness   . Hemorrhoids   . History of nuclear stress test 1996   negative  . Sleep apnea     Past Surgical History:  Procedure Laterality Date  . CATARACT EXTRACTION, BILATERAL    . KNEE ARTHROSCOPY Right   . KNEE ARTHROSCOPY Left     Family History  Problem Relation Age of Onset  . Heart disease Mother        MI @ 4979, stroke  . Heart disease Father        MI @ 7067, CABG  . Heart attack Brother   . Heart attack Brother   .  Stroke Brother      Social History   Socioeconomic History  . Marital status: Divorced    Spouse name: None  . Number of children: 2  . Years of education: None  . Highest education level: None  Social Needs  . Financial resource strain: None  . Food insecurity - worry: None  . Food insecurity - inability: None  . Transportation needs - medical: None  . Transportation needs - non-medical: None  Occupational History  . Occupation: retired Producer, television/film/videoprincipal     Employer: RETIRED    Comment: [employer]  Tobacco Use  . Smoking status: Former Smoker    Years: 6.00    Last attempt to quit: 10/20/1983    Years since quitting: 34.0  . Smokeless tobacco: Never Used  Substance and Sexual Activity  . Alcohol use: Yes    Alcohol/week: 0.6 oz    Types: 1 Glasses of wine per week    Comment: drinks 1/2 glass of wine with  dinner  . Drug use: No  . Sexual activity: No  Other Topics Concern  . None  Social History Narrative   [place], [place].   Live alone.    Divorced. 2 children, Live in [place].   Not dating anyone.   Eats all food groups.   Wears seatbelt.     Review of Systems  Constitutional: Negative for activity change, appetite change, chills, diaphoresis, fatigue, fever and unexpected weight change.  HENT: Negative for dental problem, hearing loss, sore throat, tinnitus, trouble swallowing and voice change.   Eyes: Negative for visual disturbance.  Respiratory: Negative for cough, chest tightness, shortness of breath and wheezing.   Cardiovascular: Negative for chest pain, palpitations and leg swelling.  Gastrointestinal: Negative for abdominal pain, blood in stool, constipation and diarrhea.  Endocrine: Negative for polydipsia, polyphagia and polyuria.  Genitourinary: Negative for decreased urine volume, difficulty urinating, discharge, dysuria, hematuria, penile pain, testicular pain and urgency.  Musculoskeletal: Negative for arthralgias, gait problem, myalgias, neck pain and neck stiffness.  Skin: Negative for rash.  Neurological: Negative for dizziness, tremors, syncope, facial asymmetry, weakness, light-headedness and headaches.  Psychiatric/Behavioral: Negative for behavioral problems, decreased concentration, dysphoric mood, hallucinations, sleep disturbance and suicidal ideas.       Reports that his mood is good.  Does not feel sad or depressed.  Lives alone and likes it that way.     Objective:   BP 130/76 (BP Location: Left Arm, Patient Position: Sitting, Cuff Size: Normal)   Pulse 72   Temp 98.3 F (36.8 C) (Other (Comment))   Resp 16   Ht 5\' 9"  (1.753 m)   Wt 182 lb (82.6 kg)   SpO2 97%   BMI 26.88 kg/m   Physical Exam  Constitutional: He is oriented to person, place, and time. He appears well-developed and well-nourished.  HENT:  Head: Normocephalic and  atraumatic.  Eyes: Conjunctivae and EOM are normal. Pupils are equal, round, and reactive to light. Right eye exhibits no discharge. Left eye exhibits no discharge. No scleral icterus.  Neck: Normal range of motion. Neck supple. No JVD present. No tracheal deviation present. No thyromegaly present.  Cardiovascular: Normal rate, regular rhythm and normal heart sounds.  Pulmonary/Chest: Effort normal and breath sounds normal.  Abdominal: Soft. Bowel sounds are normal. He exhibits no distension. There is no tenderness.  Musculoskeletal: Normal range of motion. He exhibits no edema or deformity.  Neurological: He is alert and oriented to person, place, and time. He displays  normal reflexes. No cranial nerve deficit. Coordination normal.  Skin: Skin is warm and dry. No erythema.  Psychiatric: He has a normal mood and affect. His speech is normal and behavior is normal. Judgment and thought content normal. Cognition and memory are not impaired. He exhibits normal recent memory and normal remote memory.  Vitals reviewed.    Assessment and Plan   1. Erectile dysfunction, unspecified erectile dysfunction type Patient counseled in detail regarding the risks of medication. Told to call or return to clinic if develop any worrisome signs or symptoms. Patient voiced understanding.   - sildenafil (VIAGRA) 50 MG tablet; Take 1 tablet (50 mg total) daily as needed by mouth for erectile dysfunction.  Dispense: 20 tablet; Refill: 0 Questions were answered regarding the appropriate timing of medication regarding sexual activity.  He was counseled that medication should be taken from 30 minutes to 4 hours of time of anticipated sexual activity.  He was counseled concerning the risks of medication. 2. Memory impairment I asked patient to please have his records from his prior workup for his memory sent to our office so that I could review them.  He would like to continue with this medication at this time.  I am in  agreement. - NAMENDA XR 7 MG CP24 24 hr capsule; Take 1 capsule (7 mg total) daily by mouth.  Dispense: 30 capsule; Refill: 11  3. Immunization due  - Tdap vaccine greater than or equal to 7yo IM  4. Family history of premature CAD Patient will follow up in 3 months.  He does not want to get cholesterol testing done today but would like to do this when he comes back.  We will also check some other blood work at the time.  We did address his questions regarding vitamin supplementation.  I did recommend that he not use an iron supplement due to the fact that iron is a free radical and he would not need to take iron unless he was iron deficient.  He has no risk for iron deficiency at this time but we will check his CBC at follow-up.  He would like to start a B12 supplement.  We discussed that this should be fine.  He was encouraged to continue with his diet and exercise plan.  He was told to call with any questions or concerns. Return in about 3 months (around 01/23/2018) for follow up/memory/labs. Aliene Beams, MD 10/23/2017

## 2017-10-30 ENCOUNTER — Telehealth: Payer: Self-pay | Admitting: *Deleted

## 2017-10-30 NOTE — Telephone Encounter (Signed)
Patient left message for Dr Malena PeerHagler's nurse to return his call regarding some medications. [phone]

## 2017-10-30 NOTE — Telephone Encounter (Signed)
I gave him the same medication that he has been on and that he brought in a prescription bottle for. I do not believe that there is a generic for this medication. Please call the pharmacy and check.  Janine Limboachel H. [name], MD

## 2017-10-30 NOTE — Telephone Encounter (Signed)
Patient states he went to pharmacy to pick up Namenda- it was $40 for 30 day supply. He asks for 90 day generic be sent

## 2017-10-30 NOTE — Telephone Encounter (Signed)
Generic is Memantine Pended

## 2017-10-31 MED ORDER — MEMANTINE HCL ER 7 MG PO CP24: 7.0000 mg | ORAL_CAPSULE | Freq: Every day | ORAL | 0 refills | Status: DC

## 2017-10-31 NOTE — Telephone Encounter (Signed)
Patient informed of message below, verbalized understanding.  

## 2017-10-31 NOTE — Telephone Encounter (Signed)
Advise that sent in for refill with the generic. Chris Limboachel H. [name], MD

## 2018-02-12 ENCOUNTER — Telehealth: Payer: Self-pay | Admitting: Family Medicine

## 2018-02-12 NOTE — Telephone Encounter (Signed)
Patient left message on nurse line stating that he has long term health insurance which will allow him to have modifications/safety features to his bathroom. He just needs a prescription or a letter to [employer]. [phone]- Claims Dept.  Does patient need an appointment for this?

## 2018-02-13 NOTE — Telephone Encounter (Signed)
Yes

## 2018-02-16 NOTE — Telephone Encounter (Signed)
Called the pt to offer an appointment, and the PT stated he did not need an appointment. I advised him that Per Dr [name] we will need an office visit to assist further.Marland Kitchen. He will call us back.

## 2018-02-22 ENCOUNTER — Other Ambulatory Visit: Payer: Self-pay | Admitting: Family Medicine

## 2018-02-24 ENCOUNTER — Other Ambulatory Visit: Payer: Self-pay | Admitting: Family Medicine

## 2018-02-24 ENCOUNTER — Telehealth: Payer: Self-pay | Admitting: Family Medicine

## 2018-02-24 NOTE — Telephone Encounter (Signed)
Patient states his prescription for memantine capsule is $127 (tier 3) , [facility]  told patient to followup with his provider to request pill form so it would be cheaper for him. miligrams are different, they make a 5mg  and a 10mg . The 10 mg is cheaper for the patient Please call to discuss.  Cb# [phone]   Also requesting a prescription for a modification for his bathroom to install a walkin bath shower because he can not step over the side of the tub. It has been approved by [employer] Long[employer] insurance. He is going to call us back with a phone and fax number.

## 2018-02-24 NOTE — Telephone Encounter (Signed)
Regarding last message:    Call: [phone] Fax: [phone] [facility]

## 2018-02-25 ENCOUNTER — Other Ambulatory Visit: Payer: Self-pay | Admitting: Family Medicine

## 2018-02-25 MED ORDER — MEMANTINE HCL 10 MG PO TABS: 10.0000 mg | ORAL_TABLET | Freq: Every day | ORAL | 0 refills | Status: DC

## 2018-02-25 NOTE — Telephone Encounter (Signed)
Is it okay to change his rx to 10 mg memantine?

## 2018-03-31 ENCOUNTER — Encounter: Payer: Self-pay | Admitting: Family Medicine

## 2018-03-31 ENCOUNTER — Ambulatory Visit (INDEPENDENT_AMBULATORY_CARE_PROVIDER_SITE_OTHER): Payer: Medicare Other | Admitting: Family Medicine

## 2018-03-31 VITALS — BP 142/80 | HR 74 | Resp 16 | Ht 69.0 in | Wt 184.0 lb

## 2018-03-31 DIAGNOSIS — R030 Elevated blood-pressure reading, without diagnosis of hypertension: Secondary | ICD-10-CM

## 2018-03-31 DIAGNOSIS — G309 Alzheimer's disease, unspecified: Secondary | ICD-10-CM

## 2018-03-31 DIAGNOSIS — E782 Mixed hyperlipidemia: Secondary | ICD-10-CM | POA: Diagnosis not present

## 2018-03-31 DIAGNOSIS — R739 Hyperglycemia, unspecified: Secondary | ICD-10-CM

## 2018-03-31 DIAGNOSIS — Z9181 History of falling: Secondary | ICD-10-CM | POA: Diagnosis not present

## 2018-03-31 DIAGNOSIS — R413 Other amnesia: Secondary | ICD-10-CM

## 2018-03-31 NOTE — Patient Instructions (Signed)
Request records from Dr. Phillips OdorGolding. You are to follow up in 1 months. You are to bring your medications and supplements to your visit. You will be scheduled for a MRI of the brain.

## 2018-03-31 NOTE — Progress Notes (Signed)
Patient ID: Chris Taylor, male    DOB: [birth year], 77 y.o.   MRN: [id number]  Chief Complaint  Patient presents with  . Osteoarthritis    follow up visit- Is fasting today for labs-  wants a letter to say that he qualifies for a walk in bath/shower     Allergies No known allergies  Subjective:   Chris Taylor is a 77 y.o. male who presents to [facility] today.  HPI Here for follow up of his memory and to get his labs done.  Reports that he does not usually have high blood pressure. Is taking vitamin D now but quit the iron pill after our last visit. Did not eat today. Reports that had never had a stroke but had hyperlipidemia in the past. Never been diabetic. Exercising and eating well. Memory is ok per patient. Feels like the namenda helps. Has been on it for 4 years. Was started on the namenda by previous PCP. Reports that his work up of his memory consisted of some labs and never had an MRI. Feels like memory has been stable on namenda and not getting worse. Has a family history of Alzheimer's dementia. Chris Taylor is 2 years older and has AD. Reports that he can be forgetful at time. Manages his life.  He reports that he can walk fine.  Exercises.  Enjoys cooking.  Is not dating anyone.  Does talk to friends on a daily basis.  Reports that he sleeps well.  Reports that he does not like to take medications.  Has had high cholesterol in the past but has not been on any medications for it.  Patient reports that due to his long term care insurance would qualify for a walk in shower. Reports that the shower that he has now has to climb over the tub to shower and has fallen trying to get in and fallen trying to get out of the tub. The last fall was around 01/29/2018. He did not go to the hospital.    Past Medical History:  Diagnosis Date  . Arthritis   . Family history of heart disease   . Forgetfulness   . Hemorrhoids   . History of nuclear stress test 1996   negative    . Sleep apnea     Past Surgical History:  Procedure Laterality Date  . CATARACT EXTRACTION, BILATERAL    . HEMORRHOID SURGERY N/A 05/17/2013   Procedure: HEMORRHOIDECTOMY;  Surgeon: Fabio Bering, MD;  Location: [facility];  Service: General;  Laterality: N/A;  . INGUINAL HERNIA REPAIR Left 02/05/2017   Procedure: REPAIR LEFT INGUINAL HERNIA;  Surgeon: Violeta Gelinas, MD;  Location: [facility] OR;  Service: General;  Laterality: Left;  . INSERTION OF MESH Left 02/05/2017   Procedure: INSERTION OF MESH;  Surgeon: Violeta Gelinas, MD;  Location: [facility] OR;  Service: General;  Laterality: Left;  . KNEE ARTHROSCOPY Right   . KNEE ARTHROSCOPY Left     Family History  Problem Relation Age of Onset  . Heart disease Mother        MI @ 79, stroke  . Heart disease Father        MI @ 42, CABG  . Heart attack Brother   . Heart attack Brother   . Stroke Brother      Social History   Socioeconomic History  . Marital status: Divorced    Spouse name: Not on file  . Number of children: 2  . Years of  education: Not on file  . Highest education level: Not on file  Occupational History  . Occupation: retired Producer, television/film/videoprincipal     Employer: RETIRED    Comment: [employer]  Social Needs  . Financial resource strain: Not on file  . Food insecurity:    Worry: Not on file    Inability: Not on file  . Transportation needs:    Medical: Not on file    Non-medical: Not on file  Tobacco Use  . Smoking status: Former Smoker    Years: 6.00    Last attempt to quit: 10/20/1983    Years since quitting: 34.4  . Smokeless tobacco: Never Used  Substance and Sexual Activity  . Alcohol use: Yes    Alcohol/week: 0.6 oz    Types: 1 Glasses of wine per week    Comment: drinks 1/2 glass of wine with dinner  . Drug use: No  . Sexual activity: Never  Lifestyle  . Physical activity:    Days per week: Not on file    Minutes per session: Not on file  . Stress: Not on file  Relationships  . Social connections:     Talks on phone: Not on file    Gets together: Not on file    Attends religious service: Not on file    Active member of club or organization: Not on file    Attends meetings of clubs or organizations: Not on file    Relationship status: Not on file  Other Topics Concern  . Not on file  Social History Narrative   [place] in [place], [place].   Live alone.    Divorced. 2 children, Live in [place].   Not dating anyone.   Eats all food groups.   Wears seatbelt.    Current Outpatient Medications on File Prior to Visit  Medication Sig Dispense Refill  . aspirin EC 81 MG tablet Take 81 mg by mouth daily.    . Cod Liver Oil OIL Take 15 mLs by mouth daily.    . Coenzyme Q10 (COQ10) 200 MG CAPS Take 200 mg by mouth 3 (three) times a week.    . docusate sodium (COLACE) 100 MG capsule Take 1 capsule (100 mg total) by mouth 2 (two) times daily. 40 capsule 0  . Garlic 1000 MG CAPS Take 1,000 mg by mouth 3 (three) times a week.    . Ginkgo 60 MG TABS Take 60 mg by mouth 3 (three) times a week.     . memantine (NAMENDA) 10 MG tablet TAKE 1 TABLET(10 MG) BY MOUTH DAILY 90 tablet 0  . Multiple Vitamin (MULTIVITAMIN WITH MINERALS) TABS tablet Take 1 tablet by mouth daily.    Marland Kitchen. zinc gluconate 50 MG tablet Take 50 mg by mouth 3 (three) times a week.    . sildenafil (VIAGRA) 50 MG tablet Take 1 tablet (50 mg total) daily as needed by mouth for erectile dysfunction. (Patient not taking: Reported on 03/31/2018) 20 tablet 0  . traMADol (ULTRAM) 50 MG tablet Take 1 tablet (50 mg total) by mouth every 6 (six) hours as needed for moderate pain or severe pain. (Patient not taking: Reported on 10/23/2017) 20 tablet 0   No current facility-administered medications on file prior to visit.      Review of Systems  Constitutional: Negative for appetite change, chills, fever and unexpected weight change.  HENT: Negative for dental problem, trouble swallowing and voice change.   Eyes: Negative for visual disturbance.   Respiratory:  Negative for cough, chest tightness, shortness of breath and wheezing.   Cardiovascular: Negative for chest pain, palpitations and leg swelling.  Gastrointestinal: Negative for abdominal pain, diarrhea, nausea and vomiting.  Genitourinary: Negative for decreased urine volume, difficulty urinating, dysuria, enuresis, frequency, genital sores, hematuria and urgency.  Musculoskeletal: Negative for myalgias and neck stiffness.  Skin: Negative for rash.  Neurological: Negative for dizziness, tremors, syncope, facial asymmetry, weakness, light-headedness, numbness and headaches.  Hematological: Negative for adenopathy. Does not bruise/bleed easily.  Psychiatric/Behavioral: Negative for behavioral problems, dysphoric mood, hallucinations, self-injury, sleep disturbance and suicidal ideas. The patient is not nervous/anxious.      Objective:   BP (!) 142/80   Pulse 74   Resp 16   Ht 5\' 9"  (1.753 m)   Wt 184 lb (83.5 kg)   SpO2 97%   BMI 27.17 kg/m   Physical Exam  Constitutional: He is oriented to person, place, and time. He appears well-developed and well-nourished.  HENT:  Head: Normocephalic and atraumatic.  Mouth/Throat: No oropharyngeal exudate.  Eyes: Pupils are equal, round, and reactive to light. Conjunctivae and EOM are normal. No scleral icterus.  Neck: Normal range of motion. Neck supple. No JVD present. No tracheal deviation present. No thyromegaly present.  Cardiovascular: Normal rate, regular rhythm and normal heart sounds.  Pulmonary/Chest: Effort normal and breath sounds normal.  Abdominal: Soft. Bowel sounds are normal. He exhibits no distension.  Musculoskeletal: He exhibits no edema.  Lymphadenopathy:    He has no cervical adenopathy.  Neurological: He is alert and oriented to person, place, and time. He displays normal reflexes. No cranial nerve deficit or sensory deficit. He exhibits normal muscle tone. Coordination normal.  Skin: Skin is warm, dry and  intact.  Psychiatric: He has a normal mood and affect. His behavior is normal. Judgment and thought content normal.  Vitals reviewed.  Depression screen [facility] 2/9 03/31/2018 10/23/2017  Decreased Interest 1 0  Down, Depressed, Hopeless 2 0  PHQ - 2 Score 3 0  Altered sleeping 3 -  Tired, decreased energy 2 -  Change in appetite 1 -  Feeling bad or failure about yourself  1 -  Trouble concentrating 0 -  Moving slowly or fidgety/restless 2 -  Suicidal thoughts 0 -  PHQ-9 Score 12 -  Difficult doing work/chores Not difficult at all -   MMSE - Mini Mental State Exam 03/31/2018  Orientation to time 5  Orientation to Place 5  Registration 3  Attention/ Calculation 2  Recall 0  Language- name 2 objects 2  Language- repeat 1  Language- follow 3 step command 3  Language- read & follow direction 1  Write a sentence 1  Copy design 0  Total score 23    Assessment and Plan  1. Memory disturbance Patient with history of memory disturbance/decline, on Namenda for 4 years.  Mini-Mental status exam score 23.  No previous imaging study.  Have been unable to obtain labs from previous PCP.  Will check labs today.  Order MRI of the brain.  Continue Namenda at this time.  Discussed with patient different etiologies of dementia.  Will follow-up after testing.  PHQ 9 score of 12.  Patient denies and defers diagnosis of depression.  He does not want any medications for his mood at this time.  We will plan on discussing at next visit. - HIV antibody - RPR - Vitamin B12 - TSH - CBC with Differential/Platelet - COMPLETE METABOLIC PANEL WITH GFR - DME Other see comment -  MR Brain W Wo Contrast; Future  2. Mixed hyperlipidemia History of hyperlipidemia.  Check lipids today. - Lipid panel  3. Elevated blood pressure reading without diagnosis of hypertension Patient has slightly elevated blood pressure today.  He denies any prior history of this.  We will plan to recheck at follow-up.  He does tell me  that he defers any medication for treatment of hypertension and we will continue to work on his diet, exercise, and salt reduction. - Urinalysis, Routine w reflex microscopic Lifestyle modifications discussed with patient including a diet emphasizing vegetables, fruits, and whole grains. Limiting intake of sodium to less than 2,400 mg per day.  Recommendations discussed include consuming low-fat dairy products, poultry, fish, legumes, non-tropical vegetable oils, and nuts; and limiting intake of sweets, sugar-sweetened beverages, and red meat. Discussed following a plan such as the Dietary Approaches to Stop Hypertension (DASH) diet. Patient to read up on this diet.   4. Risk for falls Patient has had several falls over the past year, none in 6 months.  His falls have been associated with getting in and out of his tub/shower.  He comes in today requesting an order for a walk-in shower.  He reports that with his long-term care benefits that they will make these modifications for him if it would be beneficial for his health.  He was given a DME order for this today. - DME Other see comment  5. Hyperglycemia Review of chart does reveal history of hyperglycemia in the past.  No known documented A1c.  We will plan to check today. - Hemoglobin A1c  6. Alzheimer's disease, unspecified (CODE) Previous diagnosis from PCP of Alzheimer's dementia.  We will continue  with Namenda and complete workup.  Etiologies of dementia discussed with patient today. - MR Brain W Wo Contrast; Future OV 25 minutes.  Greater than 50% of office visit spent counseling and coordinating care. Return in about 1 month (around 04/28/2018) for follow up memory. Aliene Beams, MD 03/31/2018

## 2018-04-01 LAB — HEMOGLOBIN A1C
EAG (MMOL/L): 6.2 (calc)
Hgb A1c MFr Bld: 5.5 % of total Hgb (ref <5.7)
Mean Plasma Glucose: 111 (calc)

## 2018-04-15 ENCOUNTER — Ambulatory Visit (HOSPITAL_COMMUNITY)
Admission: RE | Admit: 2018-04-15 | Discharge: 2018-04-15 | Disposition: A | Discharge status: Home / Self Care | Payer: Medicare Other | Source: Ambulatory Visit | Attending: Family Medicine | Admitting: Family Medicine

## 2018-04-15 DIAGNOSIS — R413 Other amnesia: Secondary | ICD-10-CM

## 2018-04-15 DIAGNOSIS — I739 Peripheral vascular disease, unspecified: Secondary | ICD-10-CM | POA: Diagnosis not present

## 2018-04-15 DIAGNOSIS — G309 Alzheimer's disease, unspecified: Secondary | ICD-10-CM | POA: Insufficient documentation

## 2018-04-15 LAB — POCT I-STAT CREATININE: CREATININE: 1.2 mg/dL (ref 0.61–1.24)

## 2018-04-15 MED ORDER — GADOBENATE DIMEGLUMINE 529 MG/ML IV SOLN
15.0000 mL | Freq: Once | INTRAVENOUS | Status: AC | PRN
  Administered 2018-04-15: 15 mL via INTRAVENOUS

## 2018-04-17 ENCOUNTER — Telehealth: Payer: Self-pay | Admitting: Family Medicine

## 2018-04-17 DIAGNOSIS — G4733 Obstructive sleep apnea (adult) (pediatric): Secondary | ICD-10-CM

## 2018-04-17 NOTE — Telephone Encounter (Signed)
Patient states he spoke to insurance and they will pay for one- he asks if we will send CPAP order to [facility] today so he can get it today. He also requests call back

## 2018-04-17 NOTE — Telephone Encounter (Signed)
Yes, if he has not had one, we can order.

## 2018-04-17 NOTE — Telephone Encounter (Signed)
Called patient to see if he had a more recent sleep study than the one listed in Epic in 2005. Was unable to reach. A recent sleep study is needed when a prescription for CPAP is sent to [facility]. If he has not had one recently, will you order a sleep study?

## 2018-04-17 NOTE — Telephone Encounter (Signed)
I have ordered as DME. Can continue at same settings. We will need to discuss him getting a new sleep study at his follow up.  Please send to [facility] as requested by patient in the message.  Janine Limbo. Tracie Harrier, MD

## 2018-04-17 NOTE — Telephone Encounter (Signed)
Cpap went out last night, he called medicare and told them, they advised him to call [facility] to request a new one. He needs a prescription from Dr.Hagler. Cb#: [phone] Fax#: [phone]

## 2018-04-20 NOTE — Telephone Encounter (Signed)
Patient called stating he had spoken with someone at [facility]. They told him the prescriptions were missing information. He wanted someone to call and see what was missing. I called and Amber stated that a list had been sent over with what was required. Patient would need an updated sleep study because it had been so long since he had one. I called patient back and let him know this.  At this time, the patient said he really didn't think he wanted to go through [facility]. He wants to go back to [place], which is where he got his first machine from. He just wants to get his machine so he can sleep. I told him I understood, but sometimes, you have to go through steps to get a new machine. I would send a message to Dr. Tracie Harrier and someone would get back to him when we have more information. He verbalized understanding.

## 2018-04-21 NOTE — Telephone Encounter (Signed)
Please find out if patient actually has been using his machine.  How long as it been since he has had a machine?  He has his equipment broken?

## 2018-04-21 NOTE — Telephone Encounter (Signed)
I spoke with patient yesterday. He used his machine every night. It has broken.He has had his machine 6 years and medicare told him he is eligible for a new one. They are eligible for a new machine every 5 years. Has not had sleep study in probably 11 years.

## 2018-04-21 NOTE — Telephone Encounter (Signed)
Please call in an order or fax in an order to [facility] for his machine with a diagnosis of obstructive sleep apnea.  Please get his settings from his previous sleep study.  Advised him that we can try to go ahead and get him a machine but since it is been so long since he has had a study that it would be recommend that he proceed with a sleep study at this time.

## 2018-04-22 NOTE — Telephone Encounter (Signed)
Called [facility] to try and get last settings on cpap machine. Respiratory therapist said the last time patient received anything from them was in 2008. Will follow up with patient tomorrow and try to find out where he got his last machine from. RT states usually a sleep study is required every 10 years.

## 2018-04-27 NOTE — Telephone Encounter (Signed)
Per Dr.Hagler patient will need to be seen to discuss cpap machine. Patient does not know where he got last machine. He said [place] the last time I spoke with him and when I called [facility] they haven't given patient anything since 2008. Patient doesn't want to go through [facility]. It is too much back and forth, so patient needs to be seen to discuss next steps. Patients last sleep study was 11 years ago. Patient scheduled for May 17th.

## 2018-05-01 ENCOUNTER — Ambulatory Visit (INDEPENDENT_AMBULATORY_CARE_PROVIDER_SITE_OTHER): Payer: Medicare Other | Admitting: Family Medicine

## 2018-05-01 ENCOUNTER — Encounter: Payer: Self-pay | Admitting: Family Medicine

## 2018-05-01 ENCOUNTER — Other Ambulatory Visit: Payer: Self-pay

## 2018-05-01 VITALS — BP 132/80 | HR 70 | Temp 97.9°F | Resp 12 | Ht 69.0 in | Wt 179.1 lb

## 2018-05-01 DIAGNOSIS — R413 Other amnesia: Secondary | ICD-10-CM

## 2018-05-01 DIAGNOSIS — G4733 Obstructive sleep apnea (adult) (pediatric): Secondary | ICD-10-CM | POA: Diagnosis not present

## 2018-05-01 DIAGNOSIS — G4730 Sleep apnea, unspecified: Secondary | ICD-10-CM

## 2018-05-01 NOTE — Patient Instructions (Signed)
We will call you after checking with the pharmacy about your namenda dosing.  Please see the  Neurologist. You will get a call about the appointment Get you labs done today. You will get a call about the sleep study.

## 2018-05-01 NOTE — Progress Notes (Signed)
Patient ID: TYLIN FORCE, male    DOB: [birth year], 77 y.o.   MRN: [id number]  Chief Complaint  Patient presents with  . memory disturbance    1 month f/u  . Sleep Apnea    discuss cpap machine    Allergies No known allergies  Subjective:   Chris Taylor is a 77 y.o. male who presents to [facility] today.  HPI Here for follow up and to discuss his MRI results. Reports that he does feel like his memory is still slowly declining.  He reports he feels like his mood is good. Reports that brother died of Alzheimer's dementia. Sister has memory issues and is being treated for dementia in her 56s. Reports that his mother died of CVA in her 29s. Reports that he does feel a continued issue of memory problems. Forgets what is planning on doing at times, forgets where he places things.  Has never had any heart attack or stroke.  Eats a healthy diet.  Does have a history of obstructive sleep apnea.  Is taking them  Namenda but is not sure of his dosing.  His chart indicates that he is on 10 mg a day but he reports he believes he is on 7.5 mg a day.  He has not had any falls.  He denies any headaches.  Denies any numbness or tingling in his extremities.  He did not get his lab work done for evaluation of dementia.   Reports that he does not want to have another sleep study if he has to go to the hospital to have it done.  Reports that it was worse at night of sleep he is ever had in his life.  Has not had a sleep study in over 11 years.  Continues to use his CPAP at his regular settings of 3.  Reports that his machine was broken but he is fixed it now.  He reports that if he could get a sleep study performed at home he would be willing to do this.  Blood pressures been running well.  Denies any nausea, vomiting, diarrhea.  Appetite is good.  Energy level is good.  Exercising 40 minutes every day at the senior center.  Has not had any falls.   Past Medical History:  Diagnosis  Date  . Arthritis   . Family history of heart disease   . Forgetfulness   . Hemorrhoids   . History of nuclear stress test 1996   negative  . Sleep apnea     Past Surgical History:  Procedure Laterality Date  . CATARACT EXTRACTION, BILATERAL    . HEMORRHOID SURGERY N/A 05/17/2013   Procedure: HEMORRHOIDECTOMY;  Surgeon: Fabio Bering, MD;  Location: [facility];  Service: General;  Laterality: N/A;  . INGUINAL HERNIA REPAIR Left 02/05/2017   Procedure: REPAIR LEFT INGUINAL HERNIA;  Surgeon: Violeta Gelinas, MD;  Location: [facility] OR;  Service: General;  Laterality: Left;  . INSERTION OF MESH Left 02/05/2017   Procedure: INSERTION OF MESH;  Surgeon: Violeta Gelinas, MD;  Location: [facility] OR;  Service: General;  Laterality: Left;  . KNEE ARTHROSCOPY Right   . KNEE ARTHROSCOPY Left     Family History  Problem Relation Age of Onset  . Heart disease Mother        MI @ 31, stroke  . Heart disease Father        MI @ 65, CABG  . Heart attack Brother   . Heart attack  Brother   . Stroke Brother      Social History   Socioeconomic History  . Marital status: Divorced    Spouse name: Not on file  . Number of children: 2  . Years of education: Not on file  . Highest education level: Not on file  Occupational History  . Occupation: retired Producer, television/film/video: RETIRED    Comment: [employer]  Social Needs  . Financial resource strain: Not on file  . Food insecurity:    Worry: Not on file    Inability: Not on file  . Transportation needs:    Medical: Not on file    Non-medical: Not on file  Tobacco Use  . Smoking status: Former Smoker    Years: 6.00    Last attempt to quit: 10/20/1983    Years since quitting: 34.5  . Smokeless tobacco: Never Used  Substance and Sexual Activity  . Alcohol use: Yes    Alcohol/week: 0.6 oz    Types: 1 Glasses of wine per week    Comment: drinks 1/2 glass of wine with dinner  . Drug use: No  . Sexual activity: Never  Lifestyle  .  Physical activity:    Days per week: Not on file    Minutes per session: Not on file  . Stress: Not on file  Relationships  . Social connections:    Talks on phone: Not on file    Gets together: Not on file    Attends religious service: Not on file    Active member of club or organization: Not on file    Attends meetings of clubs or organizations: Not on file    Relationship status: Not on file  Other Topics Concern  . Not on file  Social History Narrative   Lives in [place], [place].   Live alone.    Divorced. 2 children, Live in [place].   Not dating anyone.   Eats all food groups.   Wears seatbelt.    Current Outpatient Medications on File Prior to Visit  Medication Sig Dispense Refill  . aspirin EC 81 MG tablet Take 81 mg by mouth daily.    . Cod Liver Oil OIL Take 15 mLs by mouth daily.    . Coenzyme Q10 (COQ10) 200 MG CAPS Take 200 mg by mouth 3 (three) times a week.    . docusate sodium (COLACE) 100 MG capsule Take 1 capsule (100 mg total) by mouth 2 (two) times daily. 40 capsule 0  . Garlic 1000 MG CAPS Take 1,000 mg by mouth 3 (three) times a week.    . Ginkgo 60 MG TABS Take 60 mg by mouth 3 (three) times a week.     . memantine (NAMENDA) 10 MG tablet TAKE 1 TABLET(10 MG) BY MOUTH DAILY 90 tablet 0  . Multiple Vitamin (MULTIVITAMIN WITH MINERALS) TABS tablet Take 1 tablet by mouth daily.    . sildenafil (VIAGRA) 50 MG tablet Take 1 tablet (50 mg total) daily as needed by mouth for erectile dysfunction. 20 tablet 0  . zinc gluconate 50 MG tablet Take 50 mg by mouth 3 (three) times a week.     No current facility-administered medications on file prior to visit.     Review of Systems  Constitutional: Negative for appetite change, chills, fever and unexpected weight change.  HENT: Negative for trouble swallowing and voice change.   Eyes: Negative for visual disturbance.  Respiratory: Negative for cough, chest tightness, shortness  of breath and wheezing.   Cardiovascular:  Negative for chest pain, palpitations and leg swelling.  Gastrointestinal: Negative for abdominal pain, diarrhea, nausea and vomiting.  Genitourinary: Negative for decreased urine volume, dysuria and frequency.  Skin: Negative for rash.  Neurological: Negative for dizziness, tremors, syncope, facial asymmetry, weakness and headaches.  Hematological: Negative for adenopathy. Does not bruise/bleed easily.     Objective:   BP 132/80 (BP Location: Left Arm, Patient Position: Sitting, Cuff Size: Normal)   Pulse 70   Temp 97.9 F (36.6 C) (Temporal)   Resp 12   Ht  (1.753 m)   Wt 179 lb 1.9 oz (81.2 kg)   SpO2 97%   BMI 26.45 kg/m   Physical Exam  Constitutional: He is oriented to person, place, and time. He appears well-developed and well-nourished.  HENT:  Head: Normocephalic and atraumatic.  Eyes: Pupils are equal, round, and reactive to light. Conjunctivae and EOM are normal. No scleral icterus.  Neck: Normal range of motion. Neck supple. No thyromegaly present.  Cardiovascular: Normal rate, regular rhythm and normal heart sounds.  Pulmonary/Chest: Effort normal and breath sounds normal.  Musculoskeletal: He exhibits no edema.  Neurological: He is alert and oriented to person, place, and time. No cranial nerve deficit.  Skin: Skin is warm, dry and intact.  Psychiatric: He has a normal mood and affect. His behavior is normal. Judgment and thought content normal.  Vitals reviewed.  MMSE - Mini Mental State Exam 03/31/2018  Orientation to time 5  Orientation to Place 5  Registration 3  Attention/ Calculation 2  Recall 0  Language- name 2 objects 2  Language- repeat 1  Language- follow 3 step command 3  Language- read & follow direction 1  Write a sentence 1  Copy design 0  Total score 23    Assessment and Plan  1. Memory impairment Will call pharmacy and find out dosing of patient's Namenda.  Discussed with patient that we will increase his medication at this  time. He is agreeable for referral to neurology to see if we can discern etiology of his dementia.  I have spoken with the radiologist, Dr. Margo Aye, and he recommended neuro quantitative volumetric analysis of his brain.  His MRI was discussed in detail today.  There is no evidence of any acute changes.  There was mild white matter disease but no evidence of vascular causes of his dementia.  His Mini-Mental status exam has basically been within normal limits.  He does not have any issues of mood which could be affecting his memory.  He is agreeable to getting his labs done today.  We will be checking him for HIV, syphilis, B12, thyroid, and basic metabolic studies.  Today we did discuss possible etiologies of neurodegenerative dementia including Alzheimer's dementia, frontotemporal dementia, Lewry Body dementia, etc. referral to neurology placed today for evaluation. - Ambulatory referral to Neurology  2. OSA (obstructive sleep apnea) Patient is agreeable to home sleep study.  Will place referral at this time.  This is recommended to patient.  Discussed risks of uncontrolled obstructive sleep apnea and resultant effects on his health.  He will follow-up in 3 months or sooner if needed.  Will adjust medication and call him with his recommended dose of Namenda. Office visit today was greater than 25 minutes.  Greater than 50% of office visit spent counseling and coordinating care. Return in about 3 months (around 08/01/2018). Aliene Beams, MD 05/01/2018

## 2018-05-04 LAB — COMPLETE METABOLIC PANEL WITH GFR
AG Ratio: 1.6 (calc) (ref 1.0–2.5)
ALT: 26 U/L (ref 9–46)
AST: 31 U/L (ref 10–35)
Albumin: 4.4 g/dL (ref 3.6–5.1)
Alkaline phosphatase (APISO): 71 U/L (ref 40–115)
BUN: 15 mg/dL (ref 7–25)
CALCIUM: 9.2 mg/dL (ref 8.6–10.3)
CO2: 26 mmol/L (ref 20–32)
CREATININE: 1.16 mg/dL (ref 0.70–1.18)
Chloride: 105 mmol/L (ref 98–110)
GFR, EST NON AFRICAN AMERICAN: 60 mL/min/{1.73_m2} (ref >=60)
GFR, Est African American: 70 mL/min/{1.73_m2} (ref >=60)
Globulin: 2.8 g/dL (calc) (ref 1.9–3.7)
Glucose, Bld: 91 mg/dL (ref 65–139)
Potassium: 4.2 mmol/L (ref 3.5–5.3)
Sodium: 139 mmol/L (ref 135–146)
TOTAL PROTEIN: 7.2 g/dL (ref 6.1–8.1)
Total Bilirubin: 0.7 mg/dL (ref 0.2–1.2)

## 2018-05-04 LAB — CBC WITH DIFFERENTIAL/PLATELET
BASOS PCT: 0.6 %
Basophils Absolute: 20 cells/uL (ref 0–200)
EOS PCT: 2.7 %
Eosinophils Absolute: 92 cells/uL (ref 15–500)
HEMATOCRIT: 43.5 % (ref 38.5–50.0)
Hemoglobin: 14.9 g/dL (ref 13.2–17.1)
LYMPHS ABS: 1384 {cells}/uL (ref 850–3900)
MCH: 31.5 pg (ref 27.0–33.0)
MCHC: 34.3 g/dL (ref 32.0–36.0)
MCV: 92 fL (ref 80.0–100.0)
MPV: 10.7 fL (ref 7.5–12.5)
Monocytes Relative: 11.2 %
NEUTROS ABS: 1523 {cells}/uL (ref 1500–7800)
NEUTROS PCT: 44.8 %
PLATELETS: 185 10*3/uL (ref 140–400)
RBC: 4.73 10*6/uL (ref 4.20–5.80)
RDW: 12.6 % (ref 11.0–15.0)
TOTAL LYMPHOCYTE: 40.7 %
WBC mixed population: 381 cells/uL (ref 200–950)
WBC: 3.4 10*3/uL — ABNORMAL LOW (ref 3.8–10.8)

## 2018-05-04 LAB — RPR: RPR Ser Ql: NONREACTIVE

## 2018-05-04 LAB — URINALYSIS, ROUTINE W REFLEX MICROSCOPIC
Bilirubin Urine: NEGATIVE
Glucose, UA: NEGATIVE
Hgb urine dipstick: NEGATIVE
Ketones, ur: NEGATIVE
Leukocytes, UA: NEGATIVE
NITRITE: NEGATIVE
PH: 7.5 (ref 5.0–8.0)
Protein, ur: NEGATIVE
SPECIFIC GRAVITY, URINE: 1.02 (ref 1.001–1.03)

## 2018-05-04 LAB — VITAMIN B12: Vitamin B-12: >2000 pg/mL — ABNORMAL HIGH (ref 200–1100)

## 2018-05-04 LAB — LIPID PANEL
CHOLESTEROL: 196 mg/dL (ref <200)
HDL: 72 mg/dL (ref >40)
LDL Cholesterol (Calc): 108 mg/dL (calc) — ABNORMAL HIGH
Non-HDL Cholesterol (Calc): 124 mg/dL (calc) (ref <130)
Total CHOL/HDL Ratio: 2.7 (calc) (ref <5.0)
Triglycerides: 71 mg/dL (ref <150)

## 2018-05-04 LAB — TSH: TSH: 2.07 mIU/L (ref 0.40–4.50)

## 2018-05-04 LAB — HIV ANTIBODY (ROUTINE TESTING W REFLEX): HIV 1&2 Ab, 4th Generation: NONREACTIVE

## 2018-05-05 ENCOUNTER — Encounter: Payer: Self-pay | Admitting: Family Medicine

## 2018-05-21 ENCOUNTER — Encounter: Payer: Self-pay | Admitting: Family Medicine

## 2018-05-22 ENCOUNTER — Encounter: Payer: Self-pay | Admitting: Family Medicine

## 2018-05-25 ENCOUNTER — Other Ambulatory Visit: Payer: Self-pay | Admitting: Family Medicine

## 2018-05-28 NOTE — Progress Notes (Signed)
Per Lorin PicketScott at his pharmacy Namenda (generic) 10 mg qd last filled 6/12

## 2018-06-29 ENCOUNTER — Encounter: Payer: Self-pay | Admitting: Neurology

## 2018-06-29 ENCOUNTER — Ambulatory Visit: Payer: Medicare Other | Admitting: Neurology

## 2018-06-29 VITALS — BP 133/81 | HR 69 | Ht 68.5 in | Wt 179.0 lb

## 2018-06-29 DIAGNOSIS — F0390 Unspecified dementia without behavioral disturbance: Secondary | ICD-10-CM

## 2018-06-29 NOTE — Progress Notes (Signed)
Subjective:    Patient ID: Chris Taylor is a 77 y.o. male.  HPI     Huston Foley, MD, PhD [facility] [address] [address] [place], [place] [address]  Dear Dr. Tracie Harrier,   I saw your patient, Chris Taylor, upon your kind request in my neurologic clinic today for initial consultation of his memory loss. The patient is unaccompanied today. As you know, [name] is a 77 year old right-handed gentleman with an underlying medical history of arthritis, sleep apnea, borderline overweight state, and family history of memory loss, who reports short-term memory issues for the past 3 years or so. He believes that he has been on the memory medication for the past 3 years or so. He is on generic Namenda 10 mg daily. I reviewed your office note from 05/01/2018. He had a recent brain MRI on 04/15/2018 with and without contrast and I reviewed the results: IMPRESSION: 1.  No acute intracranial abnormality. 2. Disproportionate appearing volume loss along both central sulci, sylvian fissures, and also the brainstem. This is nonspecific but raises the possibility of an underlying neurodegenerative disorder. 3. Mild to moderate for age nonspecific signal changes in the cerebral white matter and thalami, most commonly due to chronic small vessel disease. 4. Generalized intracranial artery tortuosity, query chronic hypertension.  He is the third youngest of a total of 8 siblings, all his brothers passed away, he has 2 sisters alive, one is older and one is younger. One of his brothers had Alzheimer's disease, his mom also had dementia in her later years, she lived to be 33 and had a stroke at age 47. His father died from heart problems at age 37. Patient is divorced and lives alone. He has 2 daughters, both in [place]. He had recent blood work through your office on 05/01/2018 and I reviewed the results: CMP was unremarkable, TSH normal, B12 above 2000, HIV  negative, RPR nonreactive, A1c in April was 5.5. He tries to eat healthy and exercise on a regular basis. He goes to the gym 4 days a week, [facility] in [place]. His driving per his report is okay, has not recently had any issues getting lost. He is a retired Chartered loss adjuster. He quit smoking 50 years ago, drinks wine one glass per day on average. He drinks caffeine in the form of coffee, 2 cups per day in the mornings.  His Past Medical History Is Significant For: Past Medical History:  Diagnosis Date  . Arthritis   . Family history of heart disease   . Forgetfulness   . Hemorrhoids   . History of nuclear stress test 1996   negative  . Sleep apnea     His Past Surgical History Is Significant For: Past Surgical History:  Procedure Laterality Date  . CATARACT EXTRACTION, BILATERAL    . HEMORRHOID SURGERY N/A 05/17/2013   Procedure: HEMORRHOIDECTOMY;  Surgeon: Fabio Bering, MD;  Location: [facility];  Service: General;  Laterality: N/A;  . INGUINAL HERNIA REPAIR Left 02/05/2017   Procedure: REPAIR LEFT INGUINAL HERNIA;  Surgeon: Violeta Gelinas, MD;  Location: [facility] OR;  Service: General;  Laterality: Left;  . INSERTION OF MESH Left 02/05/2017   Procedure: INSERTION OF MESH;  Surgeon: Violeta Gelinas, MD;  Location: [facility] OR;  Service: General;  Laterality: Left;  . KNEE ARTHROSCOPY Right   . KNEE ARTHROSCOPY Left     His Family History Is Significant For: Family History  Problem Relation Age  of Onset  . Heart disease Mother        MI @ 2279, stroke  . Heart disease Father        MI @ 7067, CABG  . Heart attack Brother   . Heart attack Brother   . Stroke Brother     His Social History Is Significant For: Social History   Socioeconomic History  . Marital status: Divorced    Spouse name: Not on file  . Number of children: 2  . Years of education: Not on file  . Highest education level: Not on file  Occupational History  . Occupation: retired Producer, television/film/videoprincipal     Employer:  RETIRED    Comment: [employer]  Social Needs  . Financial resource strain: Not on file  . Food insecurity:    Worry: Not on file    Inability: Not on file  . Transportation needs:    Medical: Not on file    Non-medical: Not on file  Tobacco Use  . Smoking status: Former Smoker    Years: 6.00    Last attempt to quit: 10/20/1983    Years since quitting: 34.7  . Smokeless tobacco: Never Used  Substance and Sexual Activity  . Alcohol use: Yes    Alcohol/week: 0.6 oz    Types: 1 Glasses of wine per week    Comment: drinks 1/2 glass of wine with dinner  . Drug use: No  . Sexual activity: Never  Lifestyle  . Physical activity:    Days per week: Not on file    Minutes per session: Not on file  . Stress: Not on file  Relationships  . Social connections:    Talks on phone: Not on file    Gets together: Not on file    Attends religious service: Not on file    Active member of club or organization: Not on file    Attends meetings of clubs or organizations: Not on file    Relationship status: Not on file  Other Topics Concern  . Not on file  Social History Narrative   [place]ives in [place], [place].   Live alone.    Divorced. 2 children, Live in [place].   Not dating anyone.   Eats all food groups.   Wears seatbelt.     His Allergies Are:  Allergies  Allergen Reactions  . No Known Allergies   :   His Current Medications Are:  Outpatient Encounter Medications as of 06/29/2018  Medication Sig  . aspirin EC 81 MG tablet Take 81 mg by mouth daily.  . Chlorpheniramine Maleate (ALLERGY PO) Take by mouth.  . Cod Liver Oil OIL Take 15 mLs by mouth daily.  . Coenzyme Q10 (COQ10) 200 MG CAPS Take 200 mg by mouth 3 (three) times a week.  . docusate sodium (COLACE) 100 MG capsule Take 1 capsule (100 mg total) by mouth 2 (two) times daily.  . Garlic 1000 MG CAPS Take 1,000 mg by mouth 3 (three) times a week.  . Ginkgo 60 MG TABS Take 60 mg by mouth 3 (three) times a week.   .  memantine (NAMENDA) 10 MG tablet TAKE 1 TABLET(10 MG) BY MOUTH DAILY  . Multiple Vitamin (MULTIVITAMIN WITH MINERALS) TABS tablet Take 1 tablet by mouth daily.  . sildenafil (VIAGRA) 50 MG tablet Take 1 tablet (50 mg total) daily as needed by mouth for erectile dysfunction.  Marland Kitchen. zinc gluconate 50 MG tablet Take 50 mg by mouth 3 (three) times  a week.   No facility-administered encounter medications on file as of 06/29/2018.   : Review of Systems:  Out of a complete 14 point review of systems, all are reviewed and negative with the exception of these symptoms as listed below:  Review of Systems  Neurological:       Pt presents today to discuss his memory. Pt is already on namenda.   Objective:  Neurological Exam  Physical Exam Physical Examination:   Vitals:   06/29/18 0958  BP: 133/81  Pulse: 69    General Examination: The patient is a very pleasant 77 y.o. male in no acute distress. He appears well-developed and well-nourished and well groomed.   HEENT: Normocephalic, atraumatic, pupils are equal, round and reactive to light and accommodation. Extraocular tracking is good without limitation to gaze excursion or nystagmus noted. Normal smooth pursuit is noted. Hearing is grossly intact. Tympanic membranes are clear bilaterally. Face is symmetric with normal facial animation and normal facial sensation. Speech is clear with no dysarthria noted. There is no hypophonia. There is no lip, neck/head, jaw or voice tremor. Neck is supple with full range of passive and active motion. There are no carotid bruits on auscultation. Oropharynx exam reveals: mild mouth dryness, adequate dental hygiene. Tongue protrudes centrally and palate elevates symmetrically.    Chest: Clear to auscultation without wheezing, rhonchi or crackles noted.  Heart: S1+S2+0, regular and normal without murmurs, rubs or gallops noted.   Abdomen: Soft, non-tender and non-distended with normal bowel sounds appreciated on  auscultation.  Extremities: There is trace pitting edema in the distal lower extremities bilaterally. Pedal pulses are intact.  Skin: Warm and dry without trophic changes noted.  Musculoskeletal: exam reveals no obvious joint deformities, tenderness or joint swelling or erythema.   Neurologically:  Mental status: The patient is awake, alert and oriented in all 4 spheres. His immediate and remote memory, attention, language skills and fund of knowledge are mildly impaired. There is no evidence of aphasia, agnosia, apraxia or anomia. Speech is clear with normal prosody and enunciation. Thought process is linear. Mood is normal and affect is normal.   On 06/29/2018: MMSE: 24/30, CDT: 4/4, AFT: 10/min.   Cranial nerves II - XII are as described above under HEENT exam. In addition: shoulder shrug is normal with equal shoulder height noted.  Motor exam: Normal bulk, strength and tone is noted. There is no drift, tremor or rebound. Romberg is negative. Reflexes are 1+ throughout. Fine motor skills and coordination: intact with normal finger taps, normal hand movements, normal rapid alternating patting, normal foot taps and normal foot agility.  Cerebellar testing: No dysmetria or intention tremor. There is no truncal or gait ataxia.  Sensory exam: intact to light touch in the upper and lower extremities.  Gait, station and balance: He stands easily. No veering to one side is noted. No leaning to one side is noted. Posture is age-appropriate and stance is narrow based. Gait shows normal stride length and normal pace. No problems turning are noted. Tandem walk is  difficult for him, not unusual for age.                Assessment and Plan:   In summary, TENNIS MCKINNON is a very pleasant 77 y.o.-year old male with an underlying medical history of arthritis, sleep apnea, borderline overweight state, and family history of memory loss, who presents for evaluation of his memory loss of about 3 years  duration per his report. He has been  on generic Namenda once daily 10 mg strength without side effects reported. He does not have much in the way of vascular risk factors. He does report a family history of dementia including Alzheimer's disease and one brother. He has mildly abnormal memory scores today. I would like to proceed with additional testing in the form of neuropsychological evaluation. He had recent blood work which I reviewed and a recent brain MRI. He is encouraged to continue with his medication regimen, we may increase his generic Namenda to twice daily soon. He is agreeable to pursuing cognitive testing. I had a long chat with the patient about my findings and the diagnosis of memory loss and dementia, its prognosis and treatment options. Implications of diagnosis explained at length with the patient. We talked about medical treatments and non-pharmacological approaches. We talked about the importance of maintaining a healthy lifestyle in general and staying active mentally and physically. I encouraged the patient to eat healthy, exercise daily and keep well hydrated, to keep a scheduled bedtime and wake time routine, to not skip any meals and eat healthy snacks in between meals and to have protein with every meal. I stressed the importance of regular exercise, within of course the patient's own mobility limitations. I encouraged the patient to keep up with current events by reading the news paper or watching the news and to do word puzzles, or if feasible, to go on [email or web address].   As far as further diagnostic testing is concerned, I suggested the following: no change with the exception of formal memory testing in the form of neuropsychological evaluation.  As far as medications are concerned, I recommended the following at this time: no change.  I answered all his questions today and the patient was in agreement with the above outlined plan. I would like to see the patient back in 6 months,  sooner if the need arises and encouraged him to call with any interim questions, concerns, problems, updates and test results.   Thank you very much for allowing me to participate in the care of this nice patient. If I can be of any further assistance to you please do not hesitate to call me at [phone].  Sincerely,   Huston Foley, MD, PhD

## 2018-06-29 NOTE — Patient Instructions (Addendum)
You have complaints of memory loss: memory loss or changes in cognitive function can have many reasons and does not always mean you have dementia. Conditions that can contribute to subjective or objective memory loss include: depression, stress, poor sleep from insomnia or sleep apnea, dehydration, fluctuation in blood sugar values, thyroid or electrolyte dysfunction and certain vitamin deficiencies. Dementia can be caused by stroke, brain atherosclerosis or brain vascular disease due to vascular risk factors (smoking, high blood pressure, high cholesterol, obesity and uncontrolled diabetes), certain degenerative brain disorders (including Parkinson's disease and Multiple sclerosis) and by Alzheimer's disease or other, more rare and sometimes hereditary causes. We typically do some blood work (which has been done recently already) and we also do a brain scan, which has been done as well. You can continue your medication for memory.   I will also request a formal cognitive test called neuropsychological evaluation which is done by a licensed neuropsychologist. We will make a referral in that regard.  I will see you back in 6 months.

## 2018-07-01 ENCOUNTER — Encounter: Payer: Self-pay | Admitting: Psychology

## 2018-08-14 ENCOUNTER — Other Ambulatory Visit: Payer: Self-pay | Admitting: Family Medicine

## 2018-08-18 ENCOUNTER — Telehealth: Payer: Self-pay | Admitting: Family Medicine

## 2018-08-18 NOTE — Telephone Encounter (Signed)
memantine (NAMENDA) 10 MG tablet--Please call this into pharmacy

## 2018-08-18 NOTE — Telephone Encounter (Signed)
Left message requesting call back. Chart shows we are no longer this patients pcp.

## 2018-08-19 ENCOUNTER — Other Ambulatory Visit: Payer: Self-pay

## 2018-08-19 MED ORDER — MEMANTINE HCL 10 MG PO TABS: ORAL_TABLET | ORAL | 0 refills | Status: DC

## 2018-08-19 NOTE — Telephone Encounter (Signed)
Prescription refilled. Patient notified while he was in the office.

## 2018-08-19 NOTE — Telephone Encounter (Signed)
Pt is establishing care with [facility]----HOWEVER, he has not been seen in that office yet, and they will not fill the medicine, the pt has the letter, that Dr Tracie Harrier will prescribe medicine thru 08-30-18. He was just in the office to see Dr Tracie Harrier on 05-01-18, he just needs this medicine, as he is out.

## 2018-08-26 ENCOUNTER — Ambulatory Visit: Payer: Medicare Other | Admitting: Family Medicine

## 2018-10-22 ENCOUNTER — Other Ambulatory Visit: Payer: Self-pay | Admitting: Family Medicine

## 2018-11-06 ENCOUNTER — Ambulatory Visit: Payer: Medicare Other | Admitting: Family Medicine

## 2018-11-18 ENCOUNTER — Encounter: Payer: Self-pay | Admitting: Family Medicine

## 2018-11-18 ENCOUNTER — Ambulatory Visit (INDEPENDENT_AMBULATORY_CARE_PROVIDER_SITE_OTHER): Payer: Medicare Other | Admitting: Family Medicine

## 2018-11-18 VITALS — BP 132/90 | HR 73 | Temp 98.4°F | Resp 15 | Ht 69.0 in | Wt 180.4 lb

## 2018-11-18 DIAGNOSIS — Z7689 Persons encountering health services in other specified circumstances: Secondary | ICD-10-CM | POA: Diagnosis not present

## 2018-11-18 DIAGNOSIS — G4733 Obstructive sleep apnea (adult) (pediatric): Secondary | ICD-10-CM | POA: Diagnosis not present

## 2018-11-18 DIAGNOSIS — R413 Other amnesia: Secondary | ICD-10-CM

## 2018-11-18 MED ORDER — MEMANTINE HCL 10 MG PO TABS: ORAL_TABLET | ORAL | 1 refills | Status: DC

## 2018-11-18 NOTE — Progress Notes (Signed)
Patient ID: Chris Taylor, male    DOB: May 05, 1941, 77 y.o.   MRN: [id number]  PCP: Danelle Berry, PA-C  Chief Complaint  Patient presents with  . Establish Care    States no concerns needs refill on Namenda, and needs sleep study    Subjective:   Chris Taylor is a 77 y.o. male, presents to clinic with CC of memory impairment and requests a refill of on his Namenda.  He is transitioning care from a another PCP, Dr. Tracie Harrier.  Does have a family history of Alzheimer's dementia, his brother did die of dementia and sister has memory issues and dementia also which started in her 61s.  His last visit with his PCP was for same complaint was in May of this year.  He does have a history of sleep apnea, does have CPAP at home, but it is broken and he has tried to fix it 2 times patient, was due to attempt at home sleep study per his PCP (chart review) and pt would like to arrange that as well.  Patient states that with his memory impairment complaint to his previous doctor Dr. Tracie Harrier in May 2019, he was started on Namenda, which she currently needs refill of, and referred to neurology.  Neurology office visit documentation has not yet been reviewed, MRI of the brain was done and this has been reviewed  MRI brain recently done 04/15/18 - results reviewed and copied below: IMPRESSION: 1.  No acute intracranial abnormality. 2. Disproportionate appearing volume loss along both central sulci, sylvian fissures, and also the brainstem. This is nonspecific but raises the possibility of an underlying neurodegenerative disorder. 3. Mild to moderate for age nonspecific signal changes in the cerebral white matter and thalami, most commonly due to chronic small vessel disease. 4. Generalized intracranial artery tortuosity, query chronic hypertension.  Electronically Signed: By: Odessa Fleming M.D. On: 04/15/2018 14:47  Patient is here by himself, he does live at home by himself.  He states that  during the holidays he noticed his siblings confusion and memory problems gradually worsened.  He does not notice any worsening of his own states that some of his forgetfulness and difficulty remembering things is just the way that he is.    Patient Active Problem List   Diagnosis Date Noted  . S/P inguinal hernia repair 02/05/2017  . Abnormal EKG 10/19/2013  . Family history of premature CAD 10/19/2013     Prior to Admission medications   Medication Sig Start Date End Date Taking? Authorizing Provider  aspirin EC 81 MG tablet Take 81 mg by mouth daily.   Yes [provider]  Chlorpheniramine Maleate (ALLERGY PO) Take by mouth.   Yes [provider]  Cod Liver Oil OIL Take 15 mLs by mouth daily.   Yes [provider]  Coenzyme Q10 (COQ10) 200 MG CAPS Take 200 mg by mouth 3 (three) times a week.   Yes [provider]  docusate sodium (COLACE) 100 MG capsule Take 1 capsule (100 mg total) by mouth 2 (two) times daily. 02/06/17  Yes Violeta Gelinas, MD  Garlic 1000 MG CAPS Take 1,000 mg by mouth 3 (three) times a week.   Yes [provider]  Ginkgo 60 MG TABS Take 60 mg by mouth 3 (three) times a week.    Yes [provider]  memantine (NAMENDA) 10 MG tablet TAKE 1 TABLET(10 MG) BY MOUTH DAILY 08/19/18  Yes Aliene Beams, MD  Multiple Vitamin (MULTIVITAMIN  WITH MINERALS) TABS tablet Take 1 tablet by mouth daily.   Yes [provider]  sildenafil (VIAGRA) 50 MG tablet Take 1 tablet (50 mg total) daily as needed by mouth for erectile dysfunction. 10/23/17  Yes Aliene BeamsHagler, Rachel, MD  zinc gluconate 50 MG tablet Take 50 mg by mouth 3 (three) times a week.   Yes [provider]     Allergies  Allergen Reactions  . No Known Allergies      Family History  Problem Relation Age of Onset  . Heart disease Mother        MI @ 7079, stroke  . Heart disease Father        MI @ 5167, CABG  . Heart attack Brother   . Heart attack  Brother   . Stroke Brother      Social History   Socioeconomic History  . Marital status: Divorced    Spouse name: Not on file  . Number of children: 2  . Years of education: Not on file  . Highest education level: Not on file  Occupational History  . Occupation: retired Producer, television/film/videoprincipal     Employer: RETIRED    Comment: [employer]  Social Needs  . Financial resource strain: Not on file  . Food insecurity:    Worry: Not on file    Inability: Not on file  . Transportation needs:    Medical: Not on file    Non-medical: Not on file  Tobacco Use  . Smoking status: Former Smoker    Years: 6.00    Last attempt to quit: 10/20/1983    Years since quitting: 35.1  . Smokeless tobacco: Never Used  Substance and Sexual Activity  . Alcohol use: Yes    Alcohol/week: 1.0 standard drinks    Types: 1 Glasses of wine per week    Comment: drinks 1/2 glass of wine with dinner  . Drug use: No  . Sexual activity: Never  Lifestyle  . Physical activity:    Days per week: Not on file    Minutes per session: Not on file  . Stress: Not on file  Relationships  . Social connections:    Talks on phone: Not on file    Gets together: Not on file    Attends religious service: Not on file    Active member of club or organization: Not on file    Attends meetings of clubs or organizations: Not on file    Relationship status: Not on file  . Intimate partner violence:    Fear of current or ex partner: Not on file    Emotionally abused: Not on file    Physically abused: Not on file    Forced sexual activity: Not on file  Other Topics Concern  . Not on file  Social History Narrative   [place]ives in [place], [place]   Live alone.    Divorced. 2 children, Live in [place].   Not dating anyone.   Eats all food groups.   Wears seatbelt.      Review of Systems  Constitutional: Negative.   HENT: Negative.   Eyes: Negative.   Respiratory: Negative.   Cardiovascular: Negative.     Gastrointestinal: Negative.   Endocrine: Negative.   Genitourinary: Negative.   Musculoskeletal: Negative.   Skin: Negative.   Allergic/Immunologic: Negative.   Neurological: Negative.   Hematological: Negative.   Psychiatric/Behavioral: Negative.   All other systems reviewed and are negative.      Objective:  Vitals:   11/18/18 0924  BP: 132/90  Pulse: 73  Resp: 15  Temp: 98.4 F (36.9 C)  TempSrc: Oral  SpO2: 98%  Weight: 180 lb 6 oz (81.8 kg)  Height: 5\' 9"  (1.753 m)      Physical Exam  Constitutional: He is oriented to person, place, and time. He appears well-developed and well-nourished.  HENT:  Head: Normocephalic and atraumatic.  Nose: Nose normal.  Mouth/Throat: Oropharynx is clear and moist.  Eyes: Pupils are equal, round, and reactive to light. Conjunctivae and EOM are normal. Right eye exhibits no discharge. Left eye exhibits no discharge.  Neck: Normal range of motion. No tracheal deviation present.  Cardiovascular: Normal rate, regular rhythm, normal heart sounds and intact distal pulses. Exam reveals no gallop and no friction rub.  No murmur heard. Pulmonary/Chest: Effort normal and breath sounds normal. No stridor. No respiratory distress. He has no wheezes. He has no rales.  Abdominal: Soft. Bowel sounds are normal. He exhibits no distension. There is no tenderness.  Musculoskeletal: Normal range of motion.  Neurological: He is alert and oriented to person, place, and time. No cranial nerve deficit or sensory deficit. He exhibits normal muscle tone. Coordination normal.  Skin: Skin is warm and dry. No rash noted. He is not diaphoretic.  Psychiatric: He has a normal mood and affect. His behavior is normal.  Nursing note and vitals reviewed.       11/18/18 1000  Mini Mental Exam  1. Orientation to time ( max 5 points ) 4  2. Orientation to Place ( max 5 points ) 5  3. Registration ( max 3 points ) 3  4. Attention / Calculation ( max 5 points )  5  5. Recall ( max 3 points ) 2  6. Language-name 2 objects ( max 2 points ) 2  7. Language- repeat ( max 1 point ) 1  8. Language- follow 3 step command ( max 3 points ) 3  9. Language- read and follow direction ( max 1 point )  1  10. Write a sentence ( max 1 point ) 1  11. Copy design ( max 1 point ) 0  Total Score (max 30 points ) 27       Assessment & Plan:      ICD-10-CM   1. Memory impairment R41.3   2. OSA (obstructive sleep apnea) G47.33 Ambulatory referral to Sleep Studies  3. Encounter to establish care with new doctor Z76.89     New to establish care, requests refill of Namenda medication, MMSE done today, will refill meds at current dose, review pertinent records and will monitor the patient have him return in a few months and recheck MMSE and see if we need to add Aricept to his management.  For known obstructive sleep apnea he was referred to sleep study to do in-home sleep assessment and qualify him for a new CPAP machine, he reports that his machine he got in 2008 tonight is broken.  New to establish care, do need to review most recent records with his past PCP which are accessible through our EMR.  Will review chart and update Dx, problem list, adjust f/up care as needed.  Patient does state that his annual Medicare well exam is in April each year and is scheduling as he leaves today.    Danelle Berry, PA-C 11/18/18 9:46 AM

## 2018-12-07 ENCOUNTER — Other Ambulatory Visit: Payer: Self-pay | Admitting: Family Medicine

## 2018-12-07 DIAGNOSIS — G4733 Obstructive sleep apnea (adult) (pediatric): Secondary | ICD-10-CM

## 2018-12-22 ENCOUNTER — Ambulatory Visit: Payer: Medicare Other | Attending: Family Medicine | Admitting: Neurology

## 2018-12-22 DIAGNOSIS — G4730 Sleep apnea, unspecified: Secondary | ICD-10-CM | POA: Insufficient documentation

## 2018-12-22 DIAGNOSIS — G4733 Obstructive sleep apnea (adult) (pediatric): Secondary | ICD-10-CM | POA: Diagnosis present

## 2018-12-28 ENCOUNTER — Encounter: Payer: Medicare Other | Admitting: Psychology

## 2018-12-30 ENCOUNTER — Telehealth: Payer: Self-pay

## 2018-12-30 ENCOUNTER — Ambulatory Visit: Payer: Medicare Other | Admitting: Neurology

## 2018-12-30 NOTE — Telephone Encounter (Signed)
Pt did not show for their appt with Dr. Athar today.  

## 2018-12-31 ENCOUNTER — Encounter: Payer: Self-pay | Admitting: Neurology

## 2019-01-07 NOTE — Procedures (Signed)
   [facility] Kacie Huxtable A. Gerilyn Pilgrimoonquah, MD     [email or web address]             HOME SLEEP STUDY  LOCATION: [facility]  Patient Name: Chris DonathWilliams, Chris Taylor Study Date: 12/22/2018 Gender: Male D.O.B: 1941-04-03 Age (years): 77 Referring Provider: Aliene Beamsachel Hagler Height (inches): 69 Interpreting Physician: Beryle BeamsKofi Cameo Shewell MD, ABSM Weight (lbs): 180 RPSGT: Peak, Robert BMI: 27 MRN: [id number] Neck Size: CLINICAL INFORMATION Sleep Study Type: HST     Indication for sleep study: OSA     Epworth Sleepiness Score: NA  SLEEP STUDY TECHNIQUE A multi-channel overnight portable sleep study was performed. The channels recorded were: nasal airflow, thoracic respiratory movement, and oxygen saturation with a pulse oximetry. Snoring was also monitored.  MEDICATIONS Patient self administered medications include: N/A.  Current Outpatient Medications:  .  aspirin EC 81 MG tablet, Take 81 mg by mouth daily., Disp: , Rfl:  .  Chlorpheniramine Maleate (ALLERGY PO), Take by mouth., Disp: , Rfl:  .  Cod Liver Oil OIL, Take 15 mLs by mouth daily., Disp: , Rfl:  .  Coenzyme Q10 (COQ10) 200 MG CAPS, Take 200 mg by mouth 3 (three) times a week., Disp: , Rfl:  .  docusate sodium (COLACE) 100 MG capsule, Take 1 capsule (100 mg total) by mouth 2 (two) times daily., Disp: 40 capsule, Rfl: 0 .  Garlic 1000 MG CAPS, Take 1,000 mg by mouth 3 (three) times a week., Disp: , Rfl:  .  Ginkgo 60 MG TABS, Take 60 mg by mouth 3 (three) times a week. , Disp: , Rfl:  .  memantine (NAMENDA) 10 MG tablet, TAKE 1 TABLET(10 MG) BY MOUTH DAILY, Disp: 90 tablet, Rfl: 1 .  Multiple Vitamin (MULTIVITAMIN WITH MINERALS) TABS tablet, Take 1 tablet by mouth daily., Disp: , Rfl:  .  sildenafil (VIAGRA) 50 MG tablet, Take 1 tablet (50 mg total) daily as needed by mouth for erectile dysfunction., Disp: 20 tablet, Rfl: 0 .  zinc gluconate 50 MG tablet, Take 50 mg by mouth 3 (three) times a week., Disp: , Rfl:    SLEEP  ARCHITECTURE Patient was studied for 427.6 minutes. The sleep efficiency was 99.3 % and the patient was supine for 86.9%. The arousal index was 0.0 per hour.  RESPIRATORY PARAMETERS The overall AHI was 40.6 per hour, with a central apnea index of 0.0 per hour.  The oxygen nadir was 85% during sleep.     CARDIAC DATA Mean heart rate during sleep was 64.1 bpm.  IMPRESSIONS Severe obstructive sleep apnea occurred during this study (AHI = 40.6/h).  A formal CPAP titration study.   Argie RammingKofi A Esaias Cleavenger, MD Diplomate, American Board of Sleep Medicine.  ELECTRONICALLY SIGNED ON:  01/07/2019, 8:57 PM [facility] PH: [phone]   FX: [phone] ACCREDITED BY THE AMERICAN ACADEMY OF SLEEP MEDICINE

## 2019-01-21 ENCOUNTER — Encounter: Payer: Medicare Other | Admitting: Psychology

## 2019-02-25 ENCOUNTER — Telehealth: Payer: Self-pay | Admitting: Family Medicine

## 2019-02-25 NOTE — Telephone Encounter (Signed)
Left message for Dr. Ronal Fear nurse to return my call

## 2019-02-25 NOTE — Telephone Encounter (Signed)
Can you please call Dr. Ronal Fear office to see what the next step is.   Per his note on 12/22/2018 it says: "IMPRESSIONS Severe obstructive sleep apnea occurred during this study (AHI = 40.6/h).  A formal CPAP titration study."  I am not sure if he is pending more eval/procedures from Dr. Gerilyn Pilgrim?  Or if we were supposed to take the results and do something?   I don't recall getting these results, so this is the first I'm seeing it.     Can you please call his office and ask them to check on it for the pt?  Thanks [employer]

## 2019-02-25 NOTE — Telephone Encounter (Signed)
Patient called and LVM in regards to his sleep study that was performed on 12/22/2018. Please advise?

## 2019-02-25 NOTE — Telephone Encounter (Signed)
We are following up on sleep study results, have contacted Dr. Ronal Fear office and left a message, patient called a second time this afternoon to inquire about it and he was told that we are waiting for the specialist to get back to [place].  Not sure what "A formal CPAP titration study" means per Dr. Ronal Fear note -the encounter is not closed yet and I am not sure if they are still working on this getting him CPAP and doing a titration study or if we were supposed to do it ?  The patient will be notified as soon as we hear back from the specialist  Have reviewed Chris Taylor scanned in results from the sleep study

## 2019-04-06 ENCOUNTER — Other Ambulatory Visit: Payer: Self-pay

## 2019-04-06 ENCOUNTER — Encounter: Payer: Medicare Other | Admitting: Family Medicine

## 2019-04-06 ENCOUNTER — Other Ambulatory Visit (INDEPENDENT_AMBULATORY_CARE_PROVIDER_SITE_OTHER): Payer: Medicare Other

## 2019-04-06 DIAGNOSIS — Z23 Encounter for immunization: Secondary | ICD-10-CM

## 2019-04-06 DIAGNOSIS — G309 Alzheimer's disease, unspecified: Secondary | ICD-10-CM

## 2019-04-06 DIAGNOSIS — R030 Elevated blood-pressure reading, without diagnosis of hypertension: Secondary | ICD-10-CM

## 2019-04-06 DIAGNOSIS — G4733 Obstructive sleep apnea (adult) (pediatric): Secondary | ICD-10-CM

## 2019-04-06 DIAGNOSIS — E782 Mixed hyperlipidemia: Secondary | ICD-10-CM

## 2019-04-06 NOTE — Progress Notes (Signed)
  [facility] changed to lab only appt Dx of HLD, elevated BP w/o dx of HTN, also prior hx of hyperglycemia, but last A1C done about one year ago was 5.5. Also hx of OSA and alzheimers unspecified Sleep study done but needs order for CPAP?  Need to follow up on that Will reschedule [facility] Due for PNA 23 to complete immunizations/HM - will offer to him while he is here.    ICD-10-CM   1. Mixed hyperlipidemia E78.2 COMPLETE METABOLIC PANEL WITH GFR    Lipid Panel    CBC with Differential/Platelet  2. Elevated blood pressure reading without diagnosis of hypertension R03.0 COMPLETE METABOLIC PANEL WITH GFR    Lipid Panel    CBC with Differential/Platelet  3. Alzheimer's disease, unspecified (CODE) (HCC) G30.9   4. OSA (obstructive sleep apnea) G47.33 COMPLETE METABOLIC PANEL WITH GFR    Lipid Panel    CBC with Differential/Platelet

## 2019-04-07 LAB — LIPID PANEL
Cholesterol: 210 mg/dL — ABNORMAL HIGH (ref <200)
HDL: 75 mg/dL (ref >=40)
LDL Cholesterol (Calc): 119 mg/dL (calc) — ABNORMAL HIGH
Non-HDL Cholesterol (Calc): 135 mg/dL (calc) — ABNORMAL HIGH (ref <130)
Total CHOL/HDL Ratio: 2.8 (calc) (ref <5.0)
Triglycerides: 70 mg/dL (ref <150)

## 2019-04-07 LAB — CBC WITH DIFFERENTIAL/PLATELET
Absolute Monocytes: 400 cells/uL (ref 200–950)
Basophils Absolute: 20 cells/uL (ref 0–200)
Basophils Relative: 0.5 %
Eosinophils Absolute: 120 cells/uL (ref 15–500)
Eosinophils Relative: 3 %
HCT: 46.3 % (ref 38.5–50.0)
Hemoglobin: 15.5 g/dL (ref 13.2–17.1)
Lymphs Abs: 1816 cells/uL (ref 850–3900)
MCH: 31.4 pg (ref 27.0–33.0)
MCHC: 33.5 g/dL (ref 32.0–36.0)
MCV: 93.9 fL (ref 80.0–100.0)
MPV: 10.5 fL (ref 7.5–12.5)
Monocytes Relative: 10 %
Neutro Abs: 1644 cells/uL (ref 1500–7800)
Neutrophils Relative %: 41.1 %
Platelets: 199 10*3/uL (ref 140–400)
RBC: 4.93 10*6/uL (ref 4.20–5.80)
RDW: 12.5 % (ref 11.0–15.0)
Total Lymphocyte: 45.4 %
WBC: 4 10*3/uL (ref 3.8–10.8)

## 2019-04-07 LAB — COMPLETE METABOLIC PANEL WITH GFR
AG Ratio: 1.5 (calc) (ref 1.0–2.5)
ALT: 26 U/L (ref 9–46)
AST: 37 U/L — ABNORMAL HIGH (ref 10–35)
Albumin: 4.3 g/dL (ref 3.6–5.1)
Alkaline phosphatase (APISO): 61 U/L (ref 35–144)
BUN: 11 mg/dL (ref 7–25)
CO2: 27 mmol/L (ref 20–32)
Calcium: 9.3 mg/dL (ref 8.6–10.3)
Chloride: 104 mmol/L (ref 98–110)
Creat: 1.1 mg/dL (ref 0.70–1.18)
GFR, Est African American: 74 mL/min/{1.73_m2} (ref >=60)
GFR, Est Non African American: 64 mL/min/{1.73_m2} (ref >=60)
Globulin: 2.8 g/dL (calc) (ref 1.9–3.7)
Glucose, Bld: 92 mg/dL (ref 65–99)
Potassium: 4 mmol/L (ref 3.5–5.3)
Sodium: 139 mmol/L (ref 135–146)
Total Bilirubin: 0.8 mg/dL (ref 0.2–1.2)
Total Protein: 7.1 g/dL (ref 6.1–8.1)

## 2019-04-08 ENCOUNTER — Other Ambulatory Visit: Payer: Self-pay

## 2019-04-08 ENCOUNTER — Encounter: Payer: Self-pay | Admitting: Family Medicine

## 2019-04-08 ENCOUNTER — Ambulatory Visit (INDEPENDENT_AMBULATORY_CARE_PROVIDER_SITE_OTHER): Payer: Medicare Other | Admitting: Family Medicine

## 2019-04-08 VITALS — Ht 69.0 in | Wt 181.0 lb

## 2019-04-08 DIAGNOSIS — R413 Other amnesia: Secondary | ICD-10-CM

## 2019-04-08 DIAGNOSIS — F0390 Unspecified dementia without behavioral disturbance: Secondary | ICD-10-CM | POA: Insufficient documentation

## 2019-04-08 DIAGNOSIS — G4733 Obstructive sleep apnea (adult) (pediatric): Secondary | ICD-10-CM | POA: Insufficient documentation

## 2019-04-08 DIAGNOSIS — E782 Mixed hyperlipidemia: Secondary | ICD-10-CM | POA: Diagnosis not present

## 2019-04-08 MED ORDER — SIMVASTATIN 20 MG PO TABS: 20.0000 mg | ORAL_TABLET | Freq: Every day | ORAL | 1 refills | Status: DC

## 2019-04-08 NOTE — Progress Notes (Signed)
Patient ID: MYLZ YUAN, male    DOB: 10/08/1941, 78 y.o.   MRN: [id number]  PCP: Danelle Berry, PA-C  Chief Complaint  Patient presents with  . Hyperlipidemia  . Sleep Apnea  . Memory Loss    Virtual Visit via Telephone Note  Phone visit arranged with [name] for 04/08/19 at 12:00 PM EDT  Services provided today were via telemedicine through telephone call. Start of phone call:  12:19 PM  I verified that I was speaking with the correct person using two identifiers. Patient reported their location during encounter was at home  Patient consented to telephone visit  I conducted telephone visit from [facility]  Referring Provider:   Danelle Berry, PA-C   All participants in encounter:  Myself and the patient I discussed the limitations, risks, security and privacy concerns of performing an evaluation and management service by telephone and the availability of in person appointments. I also discussed with the patient that there may be a patient responsible charge related to this service. The patient expressed understanding and agreed to proceed.   Subjective:   Chris Taylor is a 78 y.o. male, telemed phone for CC of HLD, routine annual labs done this week were abnormal and cholesterol worse, he also wanted to discuss memory and OSA.   Pt has only been here for one visit with me, Dec 2019, when he established care, past PCP was Dr. Tracie Harrier.  He did last this week because that's when he usually does them for MWV/annual exam.  He did ask many med and dx questions to the nurses when we were calling him to reschedule MWV for later date due to COVID, so he agreed to f/up telephone visit to address all of the above.  HLD - per chart review - last labs were done 04/2018 when he was under care of Dr. Tracie Harrier.  At his one visit here we addressed memory hx, he needed med refill, and he needed OSA sleep study and CPAP.  He was advised to return  in a few months to recheck.  Labs are below, total cholesterol high 210, LDL high 119     The 10-year ASCVD risk score Denman George DC Jr., et al., 2013) is: 14.4%   Values used to calculate the score:     Age: 40 years     Sex: Male     Is Non-Hispanic African American: Yes     Diabetic: No     Tobacco smoker: No     Systolic Blood Pressure: 132 mmHg     Is BP treated: No     HDL Cholesterol: 75 mg/dL     Total Cholesterol: 210 mg/dL   Memory issues - Namenda - on 10 mg dose, he has not noticed any difference in his memory or functioning. No family members have said anything.  He has taken care of other family members with demetia/alzheimers and taken care of them and he knows he "is in the early stages" of it and he wanted to ensure that we were "watching it", and he wondered if he needed a med adjustment Pt only has been to the office with our practice once, establishing in Dec, did not come back per recommendations and no-show and neuro as noted below.  OSA - pt reports that he still needs CPAP machine and study was done in Jan.  Nothing else was done after than.  A while ago our office reached  out to Neurology because the results appear to still be pending? He did see Dr. Frances FurbishAthar last July 2019 for memory.  He had an appt with neuro again after sleep study and was a no show.       Patient Active Problem List   Diagnosis Date Noted  . Memory impairment 04/08/2019  . OSA (obstructive sleep apnea) 04/08/2019  . Mixed hyperlipidemia 04/08/2019  . S/P inguinal hernia repair 02/05/2017  . Abnormal EKG 10/19/2013  . Family history of premature CAD 10/19/2013     Prior to Admission medications   Medication Sig Start Date End Date Taking? Authorizing Provider  aspirin EC 81 MG tablet Take 81 mg by mouth daily.    [provider]  Chlorpheniramine Maleate (ALLERGY PO) Take by mouth.    [provider]  Cod Liver Oil OIL Take 15 mLs by mouth daily.    [provider]  Coenzyme Q10 (COQ10) 200 MG CAPS Take 200 mg by mouth 3 (three) times a week.    [provider]  docusate sodium (COLACE) 100 MG capsule Take 1 capsule (100 mg total) by mouth 2 (two) times daily. 02/06/17   Violeta Gelinashompson, Burke, MD  Garlic 1000 MG CAPS Take 1,000 mg by mouth 3 (three) times a week.    [provider]  Ginkgo 60 MG TABS Take 60 mg by mouth 3 (three) times a week.     [provider]  memantine (NAMENDA) 10 MG tablet TAKE 1 TABLET(10 MG) BY MOUTH DAILY 11/18/18   Danelle Berryapia, Jheremy Boger, PA-C  Multiple Vitamin (MULTIVITAMIN WITH MINERALS) TABS tablet Take 1 tablet by mouth daily.    [provider]  sildenafil (VIAGRA) 50 MG tablet Take 1 tablet (50 mg total) daily as needed by mouth for erectile dysfunction. 10/23/17   Aliene BeamsHagler, Rachel, MD  simvastatin (ZOCOR) 20 MG tablet Take 1 tablet (20 mg total) by mouth at bedtime. 04/08/19   Danelle Berryapia, Kiley Torrence, PA-C  zinc gluconate 50 MG tablet Take 50 mg by mouth 3 (three) times a week.    [provider]     Allergies  Allergen Reactions  . No Known Allergies      Family History  Problem Relation Age of Onset  . Heart disease Mother        MI @ 9179, stroke  . Heart disease Father        MI @ 5667, CABG  . Heart attack Brother   . Heart attack Brother   . Stroke Brother      Social History   Socioeconomic History  . Marital status: Divorced    Spouse name: Not on file  . Number of children: 2  . Years of education: Not on file  . Highest education level: Not on file  Occupational History  . Occupation: retired Producer, television/film/videoprincipal     Employer: RETIRED    Comment: [employer]  Social Needs  . Financial resource strain: Not on file  . Food insecurity:    Worry: Not on file    Inability: Not on file  . Transportation needs:    Medical: Not on file    Non-medical: Not on file  Tobacco Use  . Smoking status: Former Smoker    Years: 6.00    Last attempt to quit: 10/20/1983    Years since  quitting: 35.4  . Smokeless tobacco: Never Used  Substance and Sexual Activity  . Alcohol use: Yes    Alcohol/week: 1.0 standard drinks  Types: 1 Glasses of wine per week    Comment: drinks 1/2 glass of wine with dinner  . Drug use: No  . Sexual activity: Never  Lifestyle  . Physical activity:    Days per week: Not on file    Minutes per session: Not on file  . Stress: Not on file  Relationships  . Social connections:    Talks on phone: Not on file    Gets together: Not on file    Attends religious service: Not on file    Active member of club or organization: Not on file    Attends meetings of clubs or organizations: Not on file    Relationship status: Not on file  . Intimate partner violence:    Fear of current or ex partner: Not on file    Emotionally abused: Not on file    Physically abused: Not on file    Forced sexual activity: Not on file  Other Topics Concern  . Not on file  Social History Narrative   Lives in [place], [place].   Live alone.    Divorced. 2 children, Live in [place].   Not dating anyone.   Eats all food groups.   Wears seatbelt.      Review of Systems  Constitutional: Negative.  Negative for activity change, appetite change, chills and unexpected weight change.  HENT: Negative.   Eyes: Negative.   Respiratory: Negative.   Cardiovascular: Negative.   Gastrointestinal: Negative.   Endocrine: Negative.   Genitourinary: Negative.   Musculoskeletal: Negative.   Skin: Negative.  Negative for color change.  Allergic/Immunologic: Negative.   Neurological: Negative.   Hematological: Negative.  Negative for adenopathy. Does not bruise/bleed easily.  Psychiatric/Behavioral: Negative.  Negative for agitation, behavioral problems, confusion, decreased concentration and dysphoric mood.  All other systems reviewed and are negative.      Objective:  Limited due to telephone encounter  Vitals:   04/08/19 1245  Weight: 181 lb (82.1 kg)  Height:   (1.753 m)      Physical Exam Vitals signs reviewed.  Pulmonary:     Breath sounds: No wheezing (no audible).  Neurological:     Mental Status: He is alert and oriented to person, place, and time.  Psychiatric:        Mood and Affect: Mood normal.        Speech: Speech normal.        Behavior: Behavior is cooperative.             Results for orders placed or performed in visit on 04/06/19  COMPLETE METABOLIC PANEL WITH GFR  Result Value Ref Range   Glucose, Bld 92 65 - 99 mg/dL   BUN 11 7 - 25 mg/dL   Creat 1.61 0.96 - 0.45 mg/dL   GFR, Est Non African American 64 > OR = 60 mL/min/1.38m2   GFR, Est African American 74 > OR = 60 mL/min/1.54m2   BUN/Creatinine Ratio NOT APPLICABLE 6 - 22 (calc)   Sodium 139 135 - 146 mmol/L   Potassium 4.0 3.5 - 5.3 mmol/L   Chloride 104 98 - 110 mmol/L   CO2 27 20 - 32 mmol/L   Calcium 9.3 8.6 - 10.3 mg/dL   Total Protein 7.1 6.1 - 8.1 g/dL   Albumin 4.3 3.6 - 5.1 g/dL   Globulin 2.8 1.9 - 3.7 g/dL (calc)   AG Ratio 1.5 1.0 - 2.5 (calc)   Total Bilirubin 0.8 0.2 - 1.2 mg/dL  Alkaline phosphatase (APISO) 61 35 - 144 U/L   AST 37 (H) 10 - 35 U/L   ALT 26 9 - 46 U/L  Lipid Panel  Result Value Ref Range   Cholesterol 210 (H) <200 mg/dL   HDL 75 > OR = 40 mg/dL   Triglycerides 70 <846 mg/dL   LDL Cholesterol (Calc) 119 (H) mg/dL (calc)   Total CHOL/HDL Ratio 2.8 <5.0 (calc)   Non-HDL Cholesterol (Calc) 135 (H) <130 mg/dL (calc)  CBC with Differential/Platelet  Result Value Ref Range   WBC 4.0 3.8 - 10.8 Thousand/uL   RBC 4.93 4.20 - 5.80 Million/uL   Hemoglobin 15.5 13.2 - 17.1 g/dL   HCT 65.9 93.5 - 70.1 %   MCV 93.9 80.0 - 100.0 fL   MCH 31.4 27.0 - 33.0 pg   MCHC 33.5 32.0 - 36.0 g/dL   RDW 77.9 39.0 - 30.0 %   Platelets 199 140 - 400 Thousand/uL   MPV 10.5 7.5 - 12.5 fL   Neutro Abs 1,644 1,500 - 7,800 cells/uL   Lymphs Abs 1,816 850 - 3,900 cells/uL   Absolute Monocytes 400 200 - 950 cells/uL   Eosinophils Absolute  120 15 - 500 cells/uL   Basophils Absolute 20 0 - 200 cells/uL   Neutrophils Relative % 41.1 %   Total Lymphocyte 45.4 %   Monocytes Relative 10.0 %   Eosinophils Relative 3.0 %   Basophils Relative 0.5 %         Assessment & Plan:      Problem List Items Addressed This Visit      Respiratory   OSA (obstructive sleep apnea)    Sleep study done - working with staff and referral coordinator to try and get him to specialist or supply company to prescribe and manage CPAP      Relevant Orders   Ambulatory referral to Neurology   Ambulatory referral to Sleep Studies     Other   Memory impairment    Est care 4 months ago, on namenda for several years (around 4) per chart review has seen neurology in the past for this, possibly lost to follow up, my only in person visit, pt screened with MMSE, and today pt notes no worsening sx that he or family members have been concerned about.   Plan to get pt back to neurology, and will continue to screen at well visits and suggested at least 2 other routine visits per year for monitoring other conditions/HLD etc      Relevant Orders   Ambulatory referral to Neurology   Mixed hyperlipidemia - Primary    Lipids elevated, pt agreeable to starting statin based on ASCVD 10- year risk 14.4% - pt otherwise very healthy  Start statin, repeat fasting labs CMP and FLP in 3 months      Relevant Medications   simvastatin (ZOCOR) 20 MG tablet         Follow Up Instructions: Neurology f/up And/or sleep specialist f/u 3 month routine OV f/up with fasting labs   I discussed the assessment and treatment plan with the patient. The patient was provided an opportunity to ask questions and all were answered. The patient agreed with the plan and demonstrated an understanding of the instructions.   The patient was advised to call back or seek an in-person evaluation if the symptoms worsen or if the condition fails to improve as anticipated.     Phone call concluded at 12:37 pm I provided 18 minutes of non-face-to-face  time during this encounter.   Danelle Berry, PA-C

## 2019-04-09 ENCOUNTER — Encounter: Payer: Self-pay | Admitting: Family Medicine

## 2019-04-09 NOTE — Assessment & Plan Note (Signed)
Est care 4 months ago, on namenda for several years (around 4) per chart review has seen neurology in the past for this, possibly lost to follow up, my only in person visit, pt screened with MMSE, and today pt notes no worsening sx that he or family members have been concerned about.   Plan to get pt back to neurology, and will continue to screen at well visits and suggested at least 2 other routine visits per year for monitoring other conditions/HLD etc

## 2019-04-09 NOTE — Assessment & Plan Note (Signed)
Lipids elevated, pt agreeable to starting statin based on ASCVD 10- year risk 14.4% - pt otherwise very healthy  Start statin, repeat fasting labs CMP and FLP in 3 months

## 2019-04-09 NOTE — Assessment & Plan Note (Signed)
Sleep study done - working with staff and referral coordinator to try and get him to specialist or supply company to prescribe and manage CPAP

## 2019-04-14 ENCOUNTER — Telehealth: Payer: Self-pay | Admitting: Family Medicine

## 2019-04-14 NOTE — Telephone Encounter (Signed)
Thank you, I have send the MD's a message to see if they have any suggestions or if they can help me manage with the information that we currently have.  Can you call the pt to notify him that I'm consulting with the other providers and will notify him if and when I find an alternative solution.   Thanks

## 2019-04-14 NOTE — Telephone Encounter (Signed)
Received a phone call back from [facility] the office Manager at Dr. Ronal Fear office. She states that patient does need just a titration study. She says that we can either order the study with the sleep lab so just putting in the order in epic or we can just have him go there for a sleep consult and they will order the titration study. Patient was a little hesitant when they reached out so I did not know if you wanted to order and then once we get the results back we can just write the script. Please advise?

## 2019-04-14 NOTE — Telephone Encounter (Signed)
Spoke with patient and informed him that we recommend he go to see Dr. Gerilyn Pilgrim at Neurology for the management of his CPAP and for the titration study. Patient became extremely frustrated on the phone and stated he does not want to pay for another office visit just to get a CPAP and he then also stated he will just do without and hung up the phone.

## 2019-04-14 NOTE — Telephone Encounter (Signed)
I would try and get him to do it with them, since I have very little experience with it and the process.  And feel free to explain that to the patient.  I really want to do what is best and most efficient for him, and I think dealing with [facility] directly would be better.  Try and see if he has other concerns?  Financial ?  There may be a reason why he has been hesitant and doing no-shows?  Thanks for doing all this!

## 2019-04-22 ENCOUNTER — Other Ambulatory Visit: Payer: Self-pay | Admitting: Family Medicine

## 2019-04-22 DIAGNOSIS — G4733 Obstructive sleep apnea (adult) (pediatric): Secondary | ICD-10-CM

## 2019-05-13 ENCOUNTER — Other Ambulatory Visit: Payer: Self-pay | Admitting: Family Medicine

## 2019-10-15 ENCOUNTER — Other Ambulatory Visit: Payer: Self-pay

## 2019-10-15 ENCOUNTER — Encounter: Payer: Self-pay | Admitting: Family Medicine

## 2019-10-15 ENCOUNTER — Ambulatory Visit (INDEPENDENT_AMBULATORY_CARE_PROVIDER_SITE_OTHER): Payer: Medicare Other | Admitting: Family Medicine

## 2019-10-15 DIAGNOSIS — R413 Other amnesia: Secondary | ICD-10-CM

## 2019-10-15 DIAGNOSIS — E782 Mixed hyperlipidemia: Secondary | ICD-10-CM | POA: Diagnosis not present

## 2019-10-15 DIAGNOSIS — R030 Elevated blood-pressure reading, without diagnosis of hypertension: Secondary | ICD-10-CM

## 2019-10-15 DIAGNOSIS — N529 Male erectile dysfunction, unspecified: Secondary | ICD-10-CM

## 2019-10-15 MED ORDER — SIMVASTATIN 20 MG PO TABS: 20.0000 mg | ORAL_TABLET | Freq: Every day | ORAL | 1 refills | Status: DC

## 2019-10-15 MED ORDER — MEMANTINE HCL 10 MG PO TABS: ORAL_TABLET | ORAL | 1 refills | Status: DC

## 2019-10-15 NOTE — Progress Notes (Signed)
Virtual Visit via Telephone Note  I connected with Chris Taylor on 10/15/19 at 2:10pm by telephone and verified that I am speaking with the correct person using two identifiers.      Pt location: at home   Physician location:  In office, [facility], Vic Blackbird MD     On call: patient and physician   I discussed the limitations, risks, security and privacy concerns of performing an evaluation and management service by telephone and the availability of in person appointments. I also discussed with the patient that there may be a patient responsible charge related to this service. The patient expressed understanding and agreed to proceed.   History of Present Illness:  Telehealth visit to review medications Lives by himself  States he is doing well , he is fearful of covid and did not want to come into the office   Hyperlipidemia- taking zocor and cod liver oil , coenzyme q 10  last LDL 119  Memory changes - he is on Namenda ,gingko no difficulty with medications   ED- takes viagra as needed   On MVI/ASA  Constipation- takes colon cleanser with water  1/2 tsp every night   He is eating well   He took his blood pressure while he was on the phone.  States that he was downstairs exercising and ran to get the phone when I called.  States that his blood pressure is usually good 361 systolic.   Observations/Objective: NAD noted on phone , BP 181/85 Peaking in full sentences  Assessment and Plan: Elevated blood pressure reading-have him monitor his blood pressure through the weekend and we will call him early next week to get his readings.  His blood pressures have been normal here in the office for his age.  Erectile dysfunction takes Viagra on a rare occasion.  He does not need this refilled at this time.  Memory changes he is doing well on Namenda  Hyperlipidemia we will plan to repeat his lipids in the next 4 to 5 months he did have labs done in April.   Continue statin drug.  Follow Up Instructions:    I discussed the assessment and treatment plan with the patient. The patient was provided an opportunity to ask questions and all were answered. The patient agreed with the plan and demonstrated an understanding of the instructions.   The patient was advised to call back or seek an in-person evaluation if the symptoms worsen or if the condition fails to improve as anticipated.  I provided 8 minutes of non-face-to-face time during this encounter. End time: 2:18pm Vic Blackbird, MD

## 2019-10-18 ENCOUNTER — Telehealth: Payer: Self-pay | Admitting: Family Medicine

## 2019-10-18 MED ORDER — AMLODIPINE BESYLATE 5 MG PO TABS: 5.0000 mg | ORAL_TABLET | Freq: Every day | ORAL | 0 refills | Status: DC

## 2019-10-18 NOTE — Telephone Encounter (Signed)
Spoke with patient and informed him that medication will be called in. Scheduled patient for an appointment at 0800 on 11/19/2019

## 2019-10-18 NOTE — Telephone Encounter (Signed)
Start norvasc 5mg  once a day F/U in office in 1 MONTH for BP check and labs Advise due to new BP meds we cant wait until the spring, we need to make sure his kidney function is okay He can be an 8am appt to get him in and out

## 2019-10-18 NOTE — Telephone Encounter (Signed)
Patient called in stating that he checked his blood pressures over the past two days and readings were as follows:  136/80 2 PM 11/01 136/101- 6PM 11/01 133/98  6 am 11/02  Patient also has c/o feeling sluggish and he believes it is due to his elevated bp. He is requesting if some medication can be called in to bring his BP down as he states that his mother had a stroke and his father passed away from heart attack which makes him extremely nervous with readings like this. Please advise?

## 2019-10-20 NOTE — Telephone Encounter (Signed)
Call placed to patient to F/U.  Reports that BP is much improved on Amlodipine. States that reading this morning was 124/78.

## 2019-10-21 ENCOUNTER — Telehealth: Payer: Self-pay | Admitting: *Deleted

## 2019-10-21 DIAGNOSIS — N529 Male erectile dysfunction, unspecified: Secondary | ICD-10-CM

## 2019-10-21 DIAGNOSIS — E782 Mixed hyperlipidemia: Secondary | ICD-10-CM

## 2019-10-21 MED ORDER — MEMANTINE HCL 10 MG PO TABS: ORAL_TABLET | ORAL | 1 refills | Status: DC

## 2019-10-21 MED ORDER — SIMVASTATIN 20 MG PO TABS: 20.0000 mg | ORAL_TABLET | Freq: Every day | ORAL | 1 refills | Status: DC

## 2019-10-21 MED ORDER — AMLODIPINE BESYLATE 5 MG PO TABS: 5.0000 mg | ORAL_TABLET | Freq: Every day | ORAL | 1 refills | Status: DC

## 2019-10-21 NOTE — Telephone Encounter (Signed)
Received fax requesting refill on Cialis to mail order.  Medication is not noted on current list.   MD please advise.

## 2019-10-22 MED ORDER — SILDENAFIL CITRATE 50 MG PO TABS: 50.0000 mg | ORAL_TABLET | Freq: Every day | ORAL | 0 refills | Status: DC | PRN

## 2019-10-22 NOTE — Telephone Encounter (Signed)
Pt is on Viagra, please clarify with him, it was last sent in 2018 by previous PCP,  Okay to refill viagra

## 2019-10-22 NOTE — Telephone Encounter (Signed)
Call placed to patient.   States that he prefers Cialis to Viagra, but he has been taking sildenafil with no issues.   Prescription sent to mail order pharmacy.

## 2019-10-24 IMAGING — MR MR HEAD WO/W CM
7 of 14 series · 29 of 48 positions shown · IV contrast (multihance)
Comparison: No prior brain imaging.

ADDENDUM:
Study discussed by telephone with Dr. SEPEHR SHARMA on 05/01/2018 at
5285 hours.

With regard to the degree and regional involvement of cerebral
volume loss in this patient, we discussed that a follow-up
NeuroQuant Volumetric Analysis Brain MRI may be valuable, and is
available on the [HOSPITAL] 3T MRI and can be arranged by
scheduling on the [REDACTED] 3T MR an outpatient "Head MRI without contrast
NeuroQuant study".
CLINICAL DATA: 77-year-old male with memory problems, dementia.
EXAM:
MRI HEAD WITHOUT AND WITH CONTRAST
TECHNIQUE: Multiplanar, multiecho pulse sequences of the brain and surrounding
structures were obtained without and with intravenous contrast.
CONTRAST:  15mL MULTIHANCE GADOBENATE DIMEGLUMINE 529 MG/ML IV SOLN

[Series 4: DWI · axial · 3.0mm · 0.78mm/px · z∈[-78,+66]mm · 4 of 49 slices shown (1 of 4)]
[im 1/49]
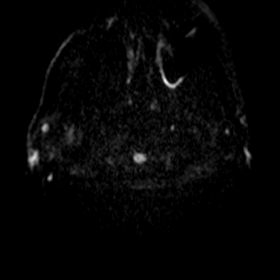
[im 17/49]
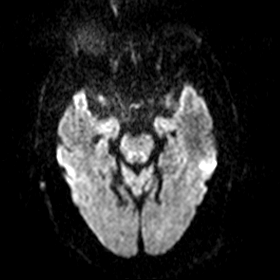
[im 33/49]
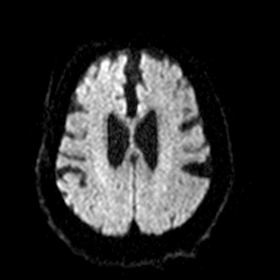
[im 49/49]
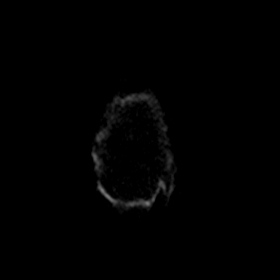

[Series 5: DWI · axial · 3.0mm · 0.78mm/px · z∈[-90,+66]mm · 5 of 53 slices shown (2 of 4)]
[im 1/53]
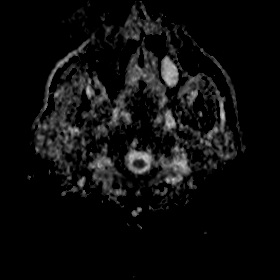
[im 14/53]
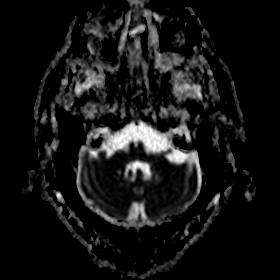
[im 27/53]
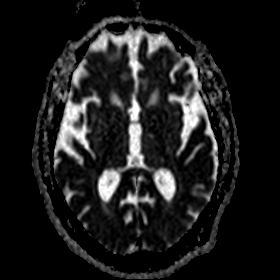
[im 40/53]
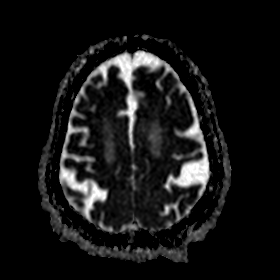
[im 53/53]
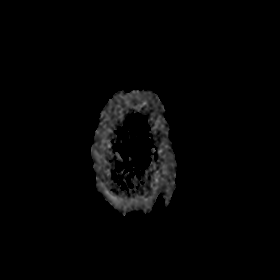

[Series 6: DWI · coronal · 5.0mm · 0.51mm/px · 3 of 34 slices shown (3 of 4)]
[im 1/34]
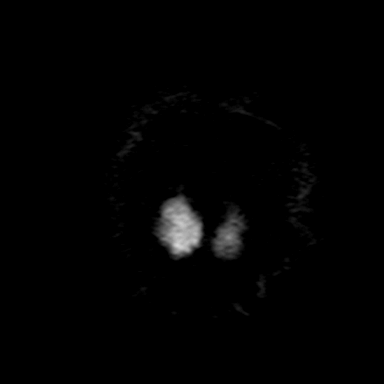
[im 17/34]
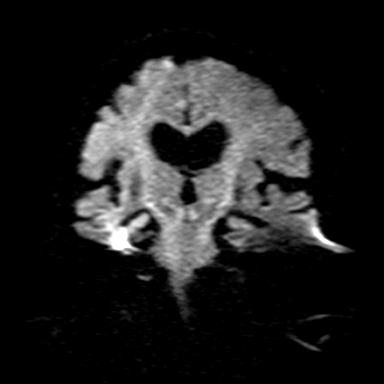
[im 34/34]
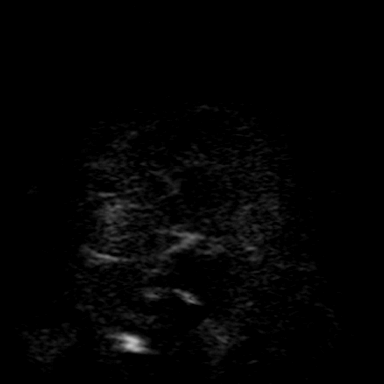

[Series 7: DWI · coronal · 5.0mm · 0.51mm/px · 3 of 34 slices shown (4 of 4)]
[im 1/34]
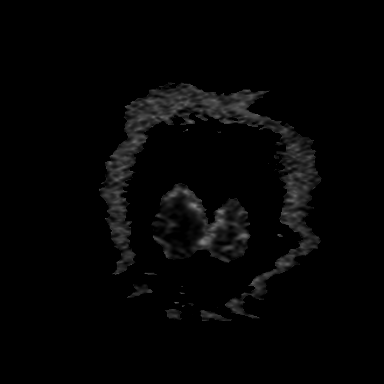
[im 17/34]
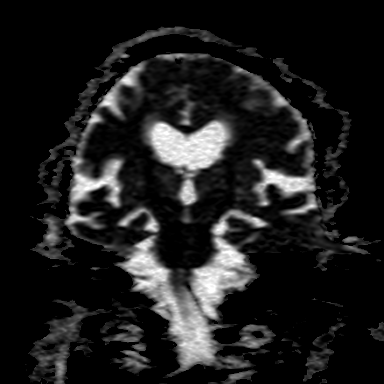
[im 34/34]
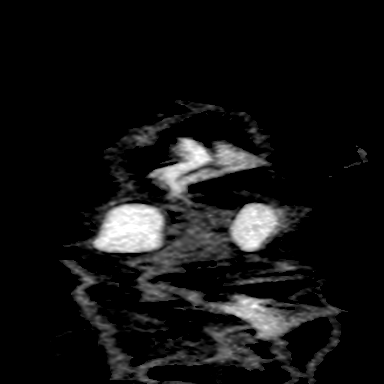

[Series 11: FLAIR · axial · 3.0mm · 0.34mm/px · z∈[-96,+42]mm · 4 of 47 slices shown (1 of 2)]
[im 1/47]
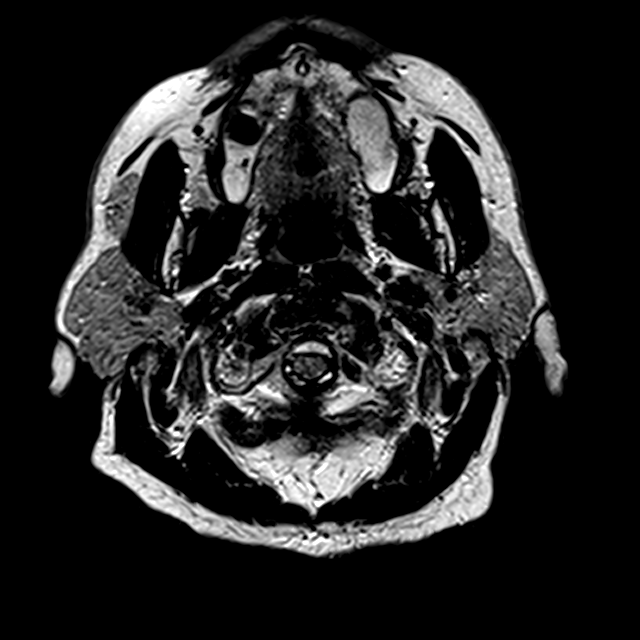
[im 16/47]
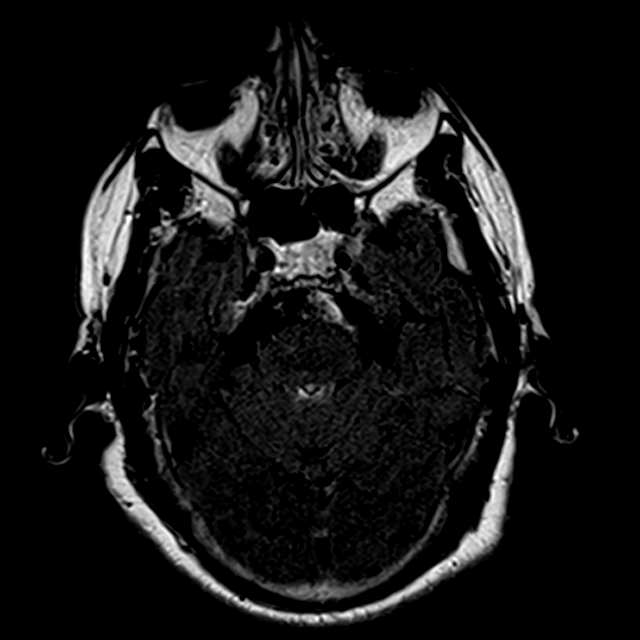
[im 31/47]
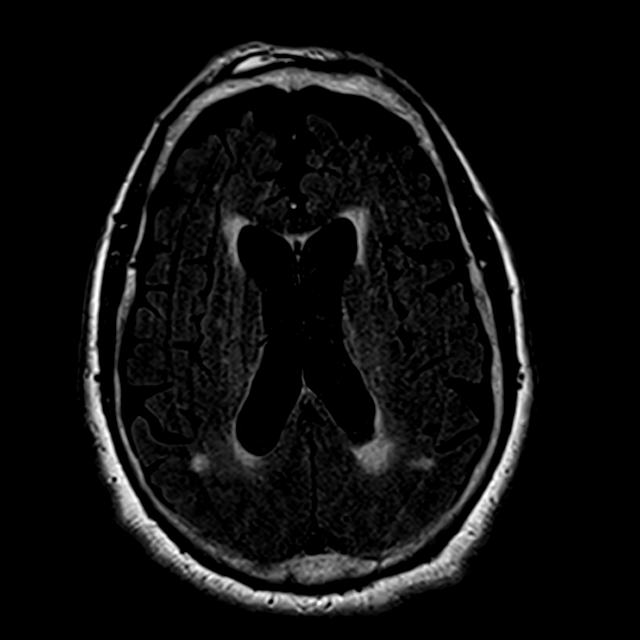
[im 47/47]
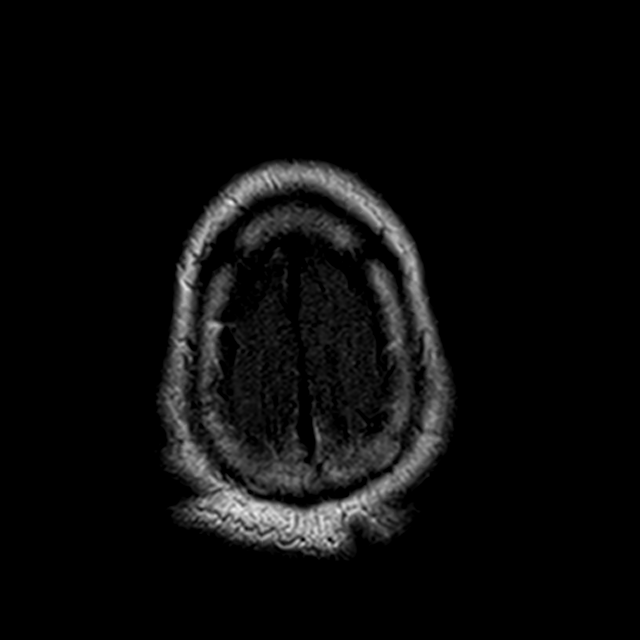

[Series 14: FLAIR · axial · 3.0mm · 0.88mm/px · z∈[-99,+48]mm · 4 of 50 slices shown (2 of 2)]
[im 1/50]
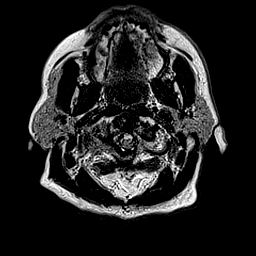
[im 17/50]
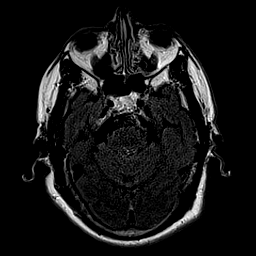
[im 33/50]
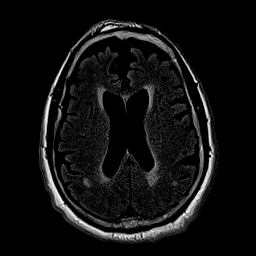
[im 50/50]
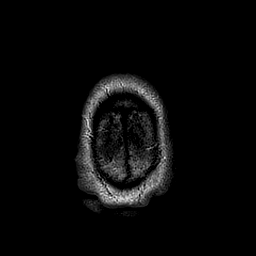

[Series 16: T1 post-contrast · axial · 2.0mm · 0.45mm/px · z∈[-100,+32]mm · 6 of 81 slices shown]
[im 1/81]
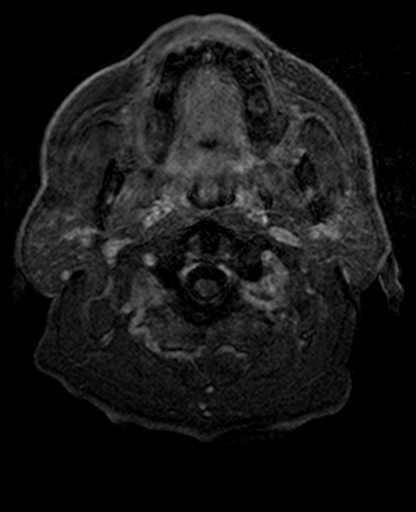
[im 14/81]
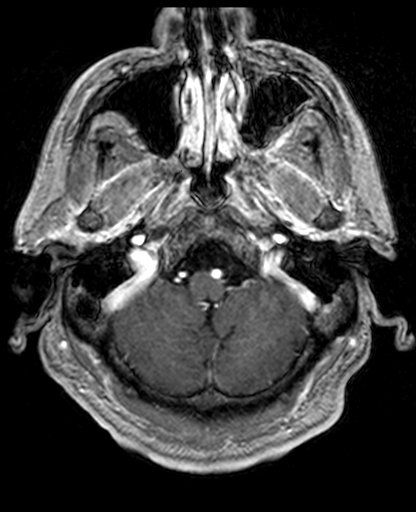
[im 27/81]
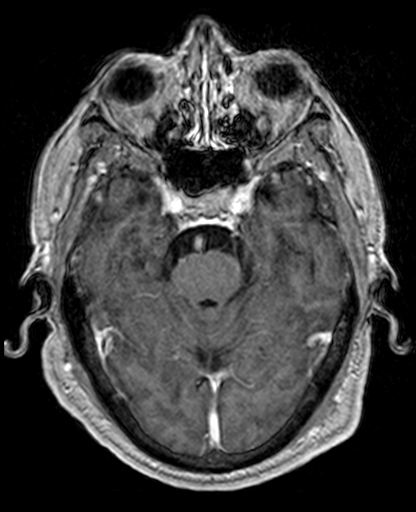
[im 41/81]
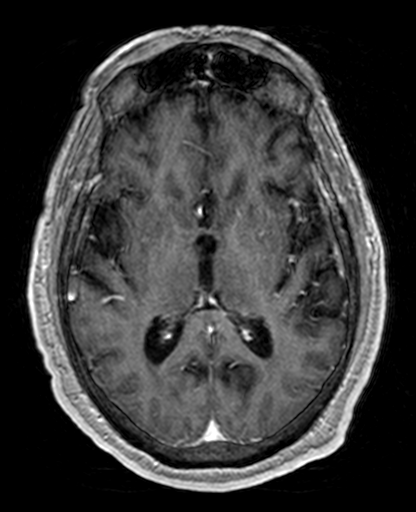
[im 54/81]
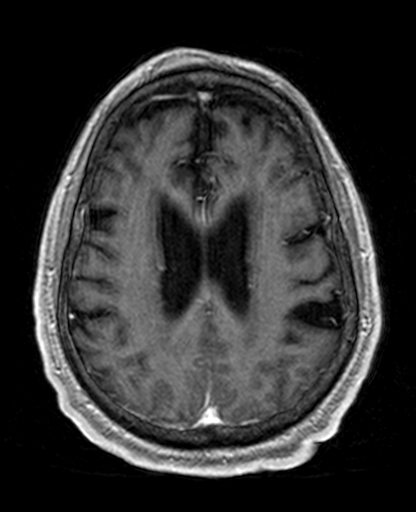
[im 67/81]
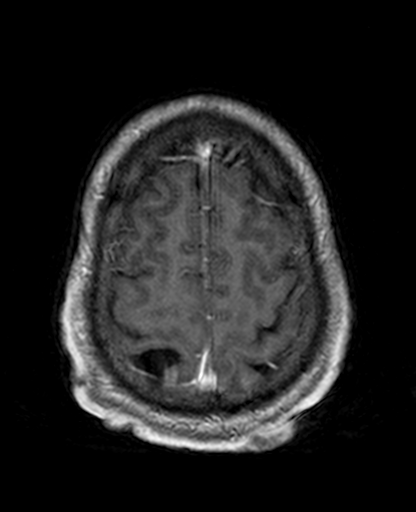

[29 of 48 positions shown; findings below may reference images not displayed]

FINDINGS: Brain: Conspicuous symmetric volume loss along the sylvian fissures
and central sulci (series 11, image 29 and series 3, image 17).
Questionable disproportionate brainstem volume loss as well (series
3, image 10).

No cortical encephalomalacia or chronic cerebral blood products are
identified. There is mild to moderate for age periventricular and
other scattered nonspecific cerebral white matter T2 and FLAIR
hyperintensity. There is also T2 heterogeneity in the bilateral
thalami. The basal ganglia appear relatively spared. Signal in the
brainstem and cerebellum appears maintained. No abnormal enhancement
identified. No dural thickening.

No restricted diffusion to suggest acute infarction. No midline
shift, mass effect, evidence of mass lesion, ventriculomegaly,
extra-axial collection or acute intracranial hemorrhage.
Cervicomedullary junction and pituitary are within normal limits.

Vascular: Major intracranial vascular flow voids are preserved with
moderate generalized intracranial artery tortuosity. The major dural
venous sinuses are enhancing and appear patent.

Skull and upper cervical spine: The visible cervical spine is normal
for age. Visualized bone marrow signal is within normal limits.

Sinuses/Orbits: Postoperative changes to both globes, otherwise
normal orbits soft tissues. Scattered mild to moderate paranasal
sinus mucosal thickening.

Other: Mastoid air cells are clear. Visible internal auditory
structures appear normal. Scalp and face soft tissues appear
negative.
IMPRESSION: 1.  No acute intracranial abnormality.
2. Disproportionate appearing volume loss along both central sulci,
sylvian fissures, and also the brainstem. This is nonspecific but
raises the possibility of an underlying neurodegenerative disorder.
3. Mild to moderate for age nonspecific signal changes in the
cerebral white matter and thalami, most commonly due to chronic
small vessel disease.
4. Generalized intracranial artery tortuosity, query chronic
hypertension.

## 2019-11-18 ENCOUNTER — Other Ambulatory Visit: Payer: Self-pay

## 2019-11-19 ENCOUNTER — Encounter: Payer: Self-pay | Admitting: Family Medicine

## 2019-11-19 ENCOUNTER — Ambulatory Visit (INDEPENDENT_AMBULATORY_CARE_PROVIDER_SITE_OTHER): Payer: Medicare Other | Admitting: Family Medicine

## 2019-11-19 VITALS — BP 140/82 | HR 90 | Temp 98.0°F | Resp 14 | Ht 69.0 in | Wt 180.0 lb

## 2019-11-19 DIAGNOSIS — G4733 Obstructive sleep apnea (adult) (pediatric): Secondary | ICD-10-CM

## 2019-11-19 DIAGNOSIS — I10 Essential (primary) hypertension: Secondary | ICD-10-CM | POA: Diagnosis not present

## 2019-11-19 DIAGNOSIS — R413 Other amnesia: Secondary | ICD-10-CM | POA: Diagnosis not present

## 2019-11-19 DIAGNOSIS — E782 Mixed hyperlipidemia: Secondary | ICD-10-CM

## 2019-11-19 NOTE — Assessment & Plan Note (Signed)
Continue statin drug check his liver function as well as his lipid panel.

## 2019-11-19 NOTE — Assessment & Plan Note (Signed)
Continue with CPAP therapy  

## 2019-11-19 NOTE — Progress Notes (Signed)
   Subjective:    Patient ID: Chris Taylor, male    DOB: [birth year], 78 y.o.   MRN: [id number]  Patient presents for Follow-up (is fasting)  Hyperlipidemia- taking zocor and cod liver oil , coenzyme q 10  last LDL 119, due for repeat labs no SE with the medications    Memory changes - he is on Namenda ,gingko no difficulty with medications, he was evaluated by neurology in 2019   OSA- on CPAP therapy for many years, supplier is Psychiatrist / Dr. Merlene Laughter did sleep study   Constipation- takes colon cleanser - blueberry detox  with water  1/2 tsp every night he has been taking this for over a year without any difficulties  HTN- he checks blood pressure twice a day, last night 117/73, has not had BP medications this AM asa he is fasting   He requested a lift chair.  States that he has deep tears at home and this will assist him with getting up.  He has not had any falls.  He does not use any ambulatory devices.  Review Of Systems:  GEN- denies fatigue, fever, weight loss,weakness, recent illness HEENT- denies eye drainage, change in vision, nasal discharge, CVS- denies chest pain, palpitations RESP- denies SOB, cough, wheeze ABD- denies N/V, change in stools, abd pain GU- denies dysuria, hematuria, dribbling, incontinence MSK- denies joint pain, muscle aches, injury Neuro- denies headache, dizziness, syncope, seizure activity       Objective:    BP 140/82   Pulse 90   Temp 98 F (36.7 C) (Temporal)   Resp 14   Ht 5\' 9"  (1.753 m)   Wt 180 lb (81.6 kg)   SpO2 97%   BMI 26.58 kg/m  GEN- NAD, alert and oriented x3 HEENT- PERRL, EOMI, non injected sclera, pink conjunctiva, MMM, oropharynx clear Neck- Supple, no thyromegaly CVS- RRR, no murmur RESP-CTAB ABD-NABS,soft,NT,ND EXT- No edema Pulses- Radial, DP- 2+        Assessment & Plan:    Description for lift chair given Problem List Items Addressed This Visit      Unprioritized   Essential  hypertension, benign    Blood pressure mildly elevated this morning however he has not had his medications.  Based on his age and comorbidities though his blood pressure looks good with the amlodipine.      Relevant Orders   CBC with Differential   Comprehensive metabolic panel   Memory impairment    Continues on Namenda he has seen neurology      Mixed hyperlipidemia - Primary    Continue statin drug check his liver function as well as his lipid panel.      Relevant Orders   Lipid Panel   OSA (obstructive sleep apnea)    Continue with CPAP therapy.         Note: This dictation was prepared with Dragon dictation along with smaller phrase technology. Any transcriptional errors that result from this process are unintentional.

## 2019-11-19 NOTE — Patient Instructions (Signed)
F/u 6 MONTHS for Physical  We will call with lab results

## 2019-11-19 NOTE — Assessment & Plan Note (Signed)
Blood pressure mildly elevated this morning however he has not had his medications.  Based on his age and comorbidities though his blood pressure looks good with the amlodipine.

## 2019-11-19 NOTE — Assessment & Plan Note (Signed)
Continues on Namenda he has seen neurology

## 2019-11-20 LAB — COMPREHENSIVE METABOLIC PANEL
AG Ratio: 1.7 (calc) (ref 1.0–2.5)
ALT: 28 U/L (ref 9–46)
AST: 36 U/L — ABNORMAL HIGH (ref 10–35)
Albumin: 4.4 g/dL (ref 3.6–5.1)
Alkaline phosphatase (APISO): 58 U/L (ref 35–144)
BUN: 12 mg/dL (ref 7–25)
CO2: 24 mmol/L (ref 20–32)
Calcium: 9.3 mg/dL (ref 8.6–10.3)
Chloride: 107 mmol/L (ref 98–110)
Creat: 1.15 mg/dL (ref 0.70–1.18)
Globulin: 2.6 g/dL (calc) (ref 1.9–3.7)
Glucose, Bld: 93 mg/dL (ref 65–99)
Potassium: 3.8 mmol/L (ref 3.5–5.3)
Sodium: 140 mmol/L (ref 135–146)
Total Bilirubin: 0.8 mg/dL (ref 0.2–1.2)
Total Protein: 7 g/dL (ref 6.1–8.1)

## 2019-11-20 LAB — CBC WITH DIFFERENTIAL/PLATELET
Absolute Monocytes: 439 {cells}/uL (ref 200–950)
Basophils Absolute: 39 {cells}/uL (ref 0–200)
Basophils Relative: 0.9 %
Eosinophils Absolute: 189 {cells}/uL (ref 15–500)
Eosinophils Relative: 4.4 %
HCT: 45.1 % (ref 38.5–50.0)
Hemoglobin: 14.9 g/dL (ref 13.2–17.1)
Lymphs Abs: 1656 {cells}/uL (ref 850–3900)
MCH: 32.5 pg (ref 27.0–33.0)
MCHC: 33 g/dL (ref 32.0–36.0)
MCV: 98.3 fL (ref 80.0–100.0)
MPV: 10.9 fL (ref 7.5–12.5)
Monocytes Relative: 10.2 %
Neutro Abs: 1978 {cells}/uL (ref 1500–7800)
Neutrophils Relative %: 46 %
Platelets: 202 Thousand/uL (ref 140–400)
RBC: 4.59 Million/uL (ref 4.20–5.80)
RDW: 12.3 % (ref 11.0–15.0)
Total Lymphocyte: 38.5 %
WBC: 4.3 Thousand/uL (ref 3.8–10.8)

## 2019-11-20 LAB — LIPID PANEL
Cholesterol: 189 mg/dL (ref <200)
HDL: 80 mg/dL (ref >=40)
LDL Cholesterol (Calc): 94 mg/dL (calc)
Non-HDL Cholesterol (Calc): 109 mg/dL (calc) (ref <130)
Total CHOL/HDL Ratio: 2.4 (calc) (ref <5.0)
Triglycerides: 67 mg/dL (ref <150)

## 2019-11-25 ENCOUNTER — Encounter: Payer: Self-pay | Admitting: *Deleted

## 2020-01-12 DIAGNOSIS — G4733 Obstructive sleep apnea (adult) (pediatric): Secondary | ICD-10-CM | POA: Diagnosis not present

## 2020-01-22 ENCOUNTER — Ambulatory Visit: Payer: Medicare Other | Attending: Internal Medicine

## 2020-01-22 DIAGNOSIS — Z23 Encounter for immunization: Secondary | ICD-10-CM | POA: Insufficient documentation

## 2020-01-22 NOTE — Progress Notes (Signed)
   Covid-19 Vaccination Clinic  Name:  TZVI ECONOMOU    MRN: [id number] DOB: Oct 27, 1941  01/22/2020  Mr. Serano was observed post Covid-19 immunization for 15 minutes without incidence. He was provided with Vaccine Information Sheet and instruction to access the V-Safe system.   Mr. Wordell was instructed to call 911 with any severe reactions post vaccine: Marland Kitchen Difficulty breathing  . Swelling of your face and throat  . A fast heartbeat  . A bad rash all over your body  . Dizziness and weakness    Immunizations Administered    Name Date Dose VIS Date Route   Moderna COVID-19 Vaccine 01/22/2020 12:53 PM 0.5 mL 11/16/2019 Intramuscular   Manufacturer: Moderna   Lot: [id number]   NDC: 47092-957-47

## 2020-02-12 DIAGNOSIS — G4733 Obstructive sleep apnea (adult) (pediatric): Secondary | ICD-10-CM | POA: Diagnosis not present

## 2020-02-22 ENCOUNTER — Ambulatory Visit: Payer: Medicare Other | Attending: Internal Medicine

## 2020-02-22 DIAGNOSIS — Z23 Encounter for immunization: Secondary | ICD-10-CM | POA: Insufficient documentation

## 2020-02-22 NOTE — Progress Notes (Signed)
   Covid-19 Vaccination Clinic  Name:  Chris Taylor    MRN: [id number] DOB: 1941/05/23  02/22/2020  [name] was observed post Covid-19 immunization for 15 minutes without incident. He was provided with Vaccine Information Sheet and instruction to access the V-Safe system.   [name] was instructed to call 911 with any severe reactions post vaccine: Marland Kitchen Difficulty breathing  . Swelling of face and throat  . A fast heartbeat  . A bad rash all over body  . Dizziness and weakness   Immunizations Administered    Name Date Dose VIS Date Route   Moderna COVID-19 Vaccine 02/22/2020 12:31 PM 0.5 mL 11/16/2019 Intramuscular   Manufacturer: Moderna   Lot: [id number]   NDC: 51102-111-73

## 2020-02-25 ENCOUNTER — Other Ambulatory Visit: Payer: Self-pay | Admitting: *Deleted

## 2020-02-25 MED ORDER — LORATADINE 10 MG PO TABS: 10.0000 mg | ORAL_TABLET | Freq: Every day | ORAL | 3 refills | Status: AC

## 2020-02-29 ENCOUNTER — Ambulatory Visit (INDEPENDENT_AMBULATORY_CARE_PROVIDER_SITE_OTHER): Payer: Medicare Other | Admitting: Nurse Practitioner

## 2020-02-29 DIAGNOSIS — B9689 Other specified bacterial agents as the cause of diseases classified elsewhere: Secondary | ICD-10-CM | POA: Diagnosis not present

## 2020-02-29 DIAGNOSIS — J0100 Acute maxillary sinusitis, unspecified: Secondary | ICD-10-CM

## 2020-02-29 DIAGNOSIS — J019 Acute sinusitis, unspecified: Secondary | ICD-10-CM

## 2020-02-29 MED ORDER — AMOXICILLIN 875 MG PO TABS: 875.0000 mg | ORAL_TABLET | Freq: Two times a day (BID) | ORAL | 0 refills | Status: DC

## 2020-02-29 MED ORDER — ALBUTEROL SULFATE HFA 108 (90 BASE) MCG/ACT IN AERS: 2.0000 | INHALATION_SPRAY | Freq: Four times a day (QID) | RESPIRATORY_TRACT | 0 refills | Status: AC | PRN

## 2020-02-29 MED ORDER — FLUTICASONE PROPIONATE 50 MCG/ACT NA SUSP: 2.0000 | Freq: Every day | NASAL | 6 refills | Status: AC

## 2020-02-29 NOTE — Progress Notes (Signed)
Subjective:    Patient ID: Chris Taylor, male    DOB: 01-25-1941, 79 y.o.   MRN: [id number] Virtual Visit via  Telephone Note    I connected withRaymond Taylor on 02/29/2020 at 1115 by telephoneand verified that I am speaking with the correct person using two identifiers. Pt was unable to log into the video visit and made telephone visit.  Pt location: at home   Physician location:  In office, [facility], Lawson Fiscal, FNP-C    On call: patient and physician   I discussed the limitations, risks, security and privacy concerns of performing an evaluation and management service by telephone and the availability of in person appointments. I also discussed with the patient that there may be a patient responsible charge related to this service. The patient expressed understanding and agreed to proceed.   History of Present Illness: Pt is a 79 year old male presenting for telephone visit related to sinus pressure. 2 weeks ago he started to have nasal congestion and clear nasal drainage. In the past 3 days he has maxillary sinus pressure, nasal congestion continues with yellow discharge. He has tried over the counter saline spray with minimal decrease in symptoms. He denied known COVID contacts. Denied fever/chills, cough, loss of taste or smell. He admits to feeling increased fatigue and mild shortness of breath however admits that he has had SOB for a while working with his insurance and regular pcp to get a bed with elevation also with h/o sleep apnea non compliant wearing CPAP.   Observations/Objective: NAD noted, able to speak in full sentences, no cough  Past Medical History:  Diagnosis Date  . Arthritis   . Family history of heart disease   . Forgetfulness   . Hemorrhoids   . History of nuclear stress test 1996   negative  . Sleep apnea     Past Surgical History:  Procedure Laterality Date  . CATARACT EXTRACTION, BILATERAL    . HEMORRHOID  SURGERY N/A 05/17/2013   Procedure: HEMORRHOIDECTOMY;  Surgeon: Fabio Bering, MD;  Location: [facility];  Service: General;  Laterality: N/A;  . INGUINAL HERNIA REPAIR Left 02/05/2017   Procedure: REPAIR LEFT INGUINAL HERNIA;  Surgeon: Violeta Gelinas, MD;  Location: [facility] OR;  Service: General;  Laterality: Left;  . INSERTION OF MESH Left 02/05/2017   Procedure: INSERTION OF MESH;  Surgeon: Violeta Gelinas, MD;  Location: [facility];  Service: General;  Laterality: Left;  . KNEE ARTHROSCOPY Right   . KNEE ARTHROSCOPY Left     Family History  Problem Relation Age of Onset  . Heart disease Mother        MI @ 35, stroke  . Stroke Mother   . Heart disease Father        MI @ 75, CABG  . Heart attack Brother   . Heart attack Brother     Social History   Socioeconomic History  . Marital status: Divorced    Spouse name: Not on file  . Number of children: 2  . Years of education: Not on file  . Highest education level: Not on file  Occupational History  . Occupation: retired Producer, television/film/video: RETIRED    Comment: [employer]  Tobacco Use  . Smoking status: Former Smoker    Years: 6.00    Quit date: 10/20/1983    Years since quitting: 36.3  . Smokeless tobacco: Never Used  Substance and Sexual Activity  .  Alcohol use: Yes    Alcohol/week: 1.0 standard drinks    Types: 1 Glasses of wine per week    Comment: drinks 1/2 glass of wine with dinner  . Drug use: No  . Sexual activity: Never  Other Topics Concern  . Not on file  Social History Narrative   Lives in [place], [place].   Live alone.    Divorced. 2 children, Live in [place].   Not dating anyone.   Eats all food groups.   Wears seatbelt.    Social Determinants of Health   Financial Resource Strain:   . Difficulty of Paying Living Expenses:   Food Insecurity:   . Worried About Charity fundraiser in the Last Year:   . Arboriculturist in the Last Year:   Transportation Needs:   . Film/video editor (Medical):     Marland Kitchen Lack of Transportation (Non-Medical):   Physical Activity:   . Days of Exercise per Week:   . Minutes of Exercise per Session:   Stress:   . Feeling of Stress :   Social Connections:   . Frequency of Communication with Friends and Family:   . Frequency of Social Gatherings with Friends and Family:   . Attends Religious Services:   . Active Member of Clubs or Organizations:   . Attends Archivist Meetings:   Marland Kitchen Marital Status:   Intimate Partner Violence:   . Fear of Current or Ex-Partner:   . Emotionally Abused:   Marland Kitchen Physically Abused:   . Sexually Abused:     Outpatient Medications Prior to Visit  Medication Sig Dispense Refill  . amLODipine (NORVASC) 5 MG tablet Take 1 tablet (5 mg total) by mouth daily. 90 tablet 1  . aspirin EC 81 MG tablet Take 81 mg by mouth daily.    . Cod Liver Oil OIL Take 15 mLs by mouth daily.    . Coenzyme Q10 (COQ10) 200 MG CAPS Take 200 mg by mouth 3 (three) times a week.    . Garlic 8502 MG CAPS Take 1,000 mg by mouth 3 (three) times a week.    . Ginkgo 60 MG TABS Take 60 mg by mouth 3 (three) times a week.     . loratadine (CLARITIN) 10 MG tablet Take 1 tablet (10 mg total) by mouth daily. 90 tablet 3  . memantine (NAMENDA) 10 MG tablet TAKE 1 TABLET(10 MG) BY MOUTH DAILY 90 tablet 1  . Multiple Vitamin (MULTIVITAMIN WITH MINERALS) TABS tablet Take 1 tablet by mouth daily.    . sildenafil (VIAGRA) 50 MG tablet Take 1 tablet (50 mg total) by mouth daily as needed for erectile dysfunction. 60 tablet 0  . simvastatin (ZOCOR) 20 MG tablet Take 1 tablet (20 mg total) by mouth at bedtime. 90 tablet 1  . zinc gluconate 50 MG tablet Take 50 mg by mouth 3 (three) times a week.     No facility-administered medications prior to visit.    Allergies  Allergen Reactions  . No Known Allergies    Assessment and Plan: 1. Your symptoms are consistent with acute bacterial sinusitis. I have called in an antibiotic, nasal spray, also an inhaler  related to your report of shortness of breath.   2. Please call number provided for COVID test  Follow Up Instructions:  I discussed the assessment and treatment plan with the patient. The patient was provided an opportunity to ask questions and all were answered. The patient agreed with the  plan and demonstrated an understanding of the instructions.  The patient was advised to call back or seek an in-person evaluation if the symptoms worsen or if the condition fails to improve as anticipated.  I provided 15  minutes of non-face-to-face time during this encounter. End Time 1134  Lawson Fiscal, FNP-C

## 2020-03-11 DIAGNOSIS — G4733 Obstructive sleep apnea (adult) (pediatric): Secondary | ICD-10-CM | POA: Diagnosis not present

## 2020-03-20 ENCOUNTER — Ambulatory Visit (INDEPENDENT_AMBULATORY_CARE_PROVIDER_SITE_OTHER): Payer: Medicare Other | Admitting: Family Medicine

## 2020-03-20 VITALS — BP 121/80 | Ht 69.0 in | Wt 180.0 lb

## 2020-03-20 DIAGNOSIS — L299 Pruritus, unspecified: Secondary | ICD-10-CM | POA: Diagnosis not present

## 2020-03-20 DIAGNOSIS — G4733 Obstructive sleep apnea (adult) (pediatric): Secondary | ICD-10-CM | POA: Diagnosis not present

## 2020-03-20 DIAGNOSIS — R0602 Shortness of breath: Secondary | ICD-10-CM | POA: Diagnosis not present

## 2020-03-20 DIAGNOSIS — R413 Other amnesia: Secondary | ICD-10-CM

## 2020-03-20 DIAGNOSIS — E782 Mixed hyperlipidemia: Secondary | ICD-10-CM | POA: Diagnosis not present

## 2020-03-20 DIAGNOSIS — Z1211 Encounter for screening for malignant neoplasm of colon: Secondary | ICD-10-CM

## 2020-03-20 MED ORDER — SIMVASTATIN 20 MG PO TABS: 20.0000 mg | ORAL_TABLET | Freq: Every day | ORAL | 1 refills | Status: DC

## 2020-03-20 MED ORDER — MEMANTINE HCL 10 MG PO TABS: ORAL_TABLET | ORAL | 1 refills | Status: DC

## 2020-03-20 MED ORDER — AMLODIPINE BESYLATE 5 MG PO TABS: 5.0000 mg | ORAL_TABLET | Freq: Every day | ORAL | 1 refills | Status: DC

## 2020-03-20 NOTE — Progress Notes (Addendum)
Virtual Visit via Telephone Note  I connected with Chris Taylor on 03/21/20 at 11:23am by telephone and verified that I am speaking with the correct person using two identifiers.      Pt location: at home   Physician location:  In office, [facility], Milinda Antis MD     On call: patient and physician   I discussed the limitations, risks, security and privacy concerns of performing an evaluation and management service by telephone and the availability of in person appointments. I also discussed with the patient that there may be a patient responsible charge related to this service. The patient expressed understanding and agreed to proceed.   History of Present Illness: Telehealth visit for itching for the past week.. Pt requested telehealth in the setting of COVID-19 pandemic. He has had itching with welps when he would scratch. He initially thought it was due to the pollen, but then thought it was due to his soap. He has been using golds bond itching lotion this is helped resolve his symptoms.  He never had any cough shortness of breath associated.  He no longer has any rash.  Of note he is also been taking his regular allergy medications Flonase and Claritin  His main concern is need for hospital bed.  He has underlying sleep apnea requiring CPAP machine.  He feels that when he lies flat he has difficulty maintaining his breathing and needs to have himself propped up in order to breathe better.  He cannot achieve this with use of his regular bed he is try to use pillows to wedge himself up wart this is not helped.  He only gets short of breath at nighttime while using the CPAP laying flat.  He does not have any shortness of breath during the day or when he exercises or with activity.  He denies any chest pain. Due to COVID-19 pandemic he does not want to return to his sleeps physician at this time  He would like cologuard for colon cancer screening     Observations/Objective:  BP 121/80 this AM NAD noted on phone   Assessment and Plan: Obstructive sleep apnea requiring CPAP therapy.  He also has underlying memory impairment on Namenda.  I think he would benefit from hospital bed so that he can be inclined to an angle to improve his shortness of breath and use of CPAP therapy.  He requires elevation greater than 30 degrees to improve his breathing with sleeping.  He also has early dementia on therapy and is at high risk for aspiration in patients with this disease elevation of the head of the bed would also decrease the risk of aspiration.  Colon cancer screening patient requested Cologuard discussed the pros and cons of this testing he would like to proceed anyway.  Pruritus resolved   Needs refills on his medications for his cholesterol and his memory Follow Up Instructions:    I discussed the assessment and treatment plan with the patient. The patient was provided an opportunity to ask questions and all were answered. The patient agreed with the plan and demonstrated an understanding of the instructions.   The patient was advised to call back or seek an in-person evaluation if the symptoms worsen or if the condition fails to improve as anticipated.  I provided 15 minutes of non-face-to-face time during this encounter. End Time 11:38am   Milinda Antis, MD

## 2020-03-21 ENCOUNTER — Encounter: Payer: Self-pay | Admitting: Family Medicine

## 2020-03-21 ENCOUNTER — Telehealth: Payer: Self-pay | Admitting: *Deleted

## 2020-03-21 NOTE — Telephone Encounter (Signed)
-----   Message from Salley Scarlet, MD sent at 03/21/2020  1:34 PM EDT ----- Regarding: Send Cologuard and hospital bed script to [facility]

## 2020-03-21 NOTE — Telephone Encounter (Signed)
Received verbal orders for Cologuard.   Order placed via [employer].   Cologuard (Order [id number])

## 2020-03-22 ENCOUNTER — Telehealth: Payer: Self-pay | Admitting: Family Medicine

## 2020-03-22 MED ORDER — HYDROXYZINE HCL 10 MG PO TABS: 10.0000 mg | ORAL_TABLET | Freq: Three times a day (TID) | ORAL | 0 refills | Status: AC | PRN

## 2020-03-22 MED ORDER — PREDNISONE 10 MG PO TABS: ORAL_TABLET | ORAL | 0 refills | Status: DC

## 2020-03-22 NOTE — Telephone Encounter (Signed)
Send in prednisone taper for allergy/pruritis

## 2020-03-22 NOTE — Telephone Encounter (Signed)
Prescription sent to pharmacy.   Call placed to patient and patient made aware per VM. 

## 2020-03-22 NOTE — Telephone Encounter (Signed)
Received call from patient.   Reports that itching has resumed with whelps to arms and legs.   Denies SOB, denies any facial involvement. Advised id these Sx noted, go to ER for evaluation.  Denies any new contact. Requested refill on Atarax. Prescription sent to pharmacy.

## 2020-03-27 ENCOUNTER — Ambulatory Visit: Payer: Medicare Other | Admitting: Family Medicine

## 2020-03-28 DIAGNOSIS — R413 Other amnesia: Secondary | ICD-10-CM | POA: Diagnosis not present

## 2020-03-28 DIAGNOSIS — G4733 Obstructive sleep apnea (adult) (pediatric): Secondary | ICD-10-CM | POA: Diagnosis not present

## 2020-03-29 LAB — COLOGUARD

## 2020-04-10 DIAGNOSIS — Z1211 Encounter for screening for malignant neoplasm of colon: Secondary | ICD-10-CM | POA: Diagnosis not present

## 2020-04-10 DIAGNOSIS — Z1212 Encounter for screening for malignant neoplasm of rectum: Secondary | ICD-10-CM | POA: Diagnosis not present

## 2020-04-10 LAB — COLOGUARD: Cologuard: NEGATIVE

## 2020-04-20 NOTE — Telephone Encounter (Signed)
Received the results of Cologuard screening.   Screening noted negative.   A negative result indicates a low likelihood of colorectal cancer is present. Following a negative Cologuard result, the American Cancer Society recommends a Cologuard re-screening interval of 3 years.   Letter sent.   

## 2020-04-27 DIAGNOSIS — R413 Other amnesia: Secondary | ICD-10-CM | POA: Diagnosis not present

## 2020-04-27 DIAGNOSIS — G4733 Obstructive sleep apnea (adult) (pediatric): Secondary | ICD-10-CM | POA: Diagnosis not present

## 2020-05-12 ENCOUNTER — Other Ambulatory Visit: Payer: Self-pay | Admitting: Family Medicine

## 2020-05-28 DIAGNOSIS — G4733 Obstructive sleep apnea (adult) (pediatric): Secondary | ICD-10-CM | POA: Diagnosis not present

## 2020-05-28 DIAGNOSIS — R413 Other amnesia: Secondary | ICD-10-CM | POA: Diagnosis not present

## 2020-06-23 ENCOUNTER — Telehealth: Payer: Self-pay | Admitting: Family Medicine

## 2020-06-23 NOTE — Telephone Encounter (Signed)
Attempted to contact [facility].   Prescription was sent in for hospital bed and mattress in April.

## 2020-06-23 NOTE — Telephone Encounter (Signed)
CB# [phone] Chris Taylor calling his mattress not working he call [facility] was told to have Dr.McQueeney fax over a order for a T-5 Threptic Mattress fax # [phone]

## 2020-06-27 DIAGNOSIS — R413 Other amnesia: Secondary | ICD-10-CM | POA: Diagnosis not present

## 2020-06-27 DIAGNOSIS — G4733 Obstructive sleep apnea (adult) (pediatric): Secondary | ICD-10-CM | POA: Diagnosis not present

## 2020-06-29 NOTE — Telephone Encounter (Signed)
Call placed to [facility].   Was advised that service ticket will be opened to have mattress evaluated and serviced.   Call placed to patient and patient made aware per VM.

## 2020-07-06 DIAGNOSIS — G4733 Obstructive sleep apnea (adult) (pediatric): Secondary | ICD-10-CM | POA: Diagnosis not present

## 2020-07-20 ENCOUNTER — Other Ambulatory Visit: Payer: Self-pay | Admitting: Family Medicine

## 2020-07-20 DIAGNOSIS — E782 Mixed hyperlipidemia: Secondary | ICD-10-CM

## 2020-07-28 DIAGNOSIS — R413 Other amnesia: Secondary | ICD-10-CM | POA: Diagnosis not present

## 2020-07-28 DIAGNOSIS — G4733 Obstructive sleep apnea (adult) (pediatric): Secondary | ICD-10-CM | POA: Diagnosis not present

## 2020-07-31 ENCOUNTER — Telehealth: Payer: Self-pay | Admitting: Family Medicine

## 2020-07-31 NOTE — Telephone Encounter (Signed)
CB# [phone] If possible can Dr.[place] call in prescription for his balance.

## 2020-08-03 NOTE — Telephone Encounter (Signed)
Call placed to patient and appointment scheduled.  

## 2020-08-07 ENCOUNTER — Ambulatory Visit (INDEPENDENT_AMBULATORY_CARE_PROVIDER_SITE_OTHER): Payer: Medicare Other | Admitting: Family Medicine

## 2020-08-07 ENCOUNTER — Encounter: Payer: Self-pay | Admitting: Family Medicine

## 2020-08-07 ENCOUNTER — Other Ambulatory Visit: Payer: Self-pay

## 2020-08-07 VITALS — BP 132/70 | HR 84 | Temp 98.2°F | Resp 16 | Ht 69.0 in | Wt 179.0 lb

## 2020-08-07 DIAGNOSIS — M170 Bilateral primary osteoarthritis of knee: Secondary | ICD-10-CM | POA: Diagnosis not present

## 2020-08-07 DIAGNOSIS — R2681 Unsteadiness on feet: Secondary | ICD-10-CM | POA: Diagnosis not present

## 2020-08-07 DIAGNOSIS — I10 Essential (primary) hypertension: Secondary | ICD-10-CM

## 2020-08-07 DIAGNOSIS — M179 Osteoarthritis of knee, unspecified: Secondary | ICD-10-CM | POA: Insufficient documentation

## 2020-08-07 DIAGNOSIS — G4733 Obstructive sleep apnea (adult) (pediatric): Secondary | ICD-10-CM | POA: Diagnosis not present

## 2020-08-07 DIAGNOSIS — M1710 Unilateral primary osteoarthritis, unspecified knee: Secondary | ICD-10-CM | POA: Insufficient documentation

## 2020-08-07 NOTE — Assessment & Plan Note (Signed)
rollator with seat Lift chair prescription provided  Mattress overlay script provided

## 2020-08-07 NOTE — Assessment & Plan Note (Signed)
Followed by neurology.   

## 2020-08-07 NOTE — Patient Instructions (Addendum)
Schedule MEDICARE WELLNESS, needs to be seen in Oct or November

## 2020-08-07 NOTE — Assessment & Plan Note (Signed)
Controlled no changes Fasting labs to be done at wellness visit

## 2020-08-07 NOTE — Progress Notes (Signed)
   Subjective:    Patient ID: Chris Taylor, male    DOB: [birth year], 79 y.o.   MRN: [id number]  Patient presents for Loss of Balance (reports that he has issues with balance if he has been sitting/ lying for a while and then gets up- no dizziness/ no falls)   Noticed his balance has been off for the past few months. No falls. No spinning or dizziness.  Likely get some things at home since he lives alone to help him be more mobile within his home.  He is requesting a walker when he feels unsteady.  He currently uses a cane.  He also requests a lift chair to help him to in and out of his chair is within his home.  He has a hospital bed but needs a mattress overlay   He denies any chest pain shortness of breath.  Denies any headaches.  His appetite is good.  Medications reviewed  He still using his CPAP machine for sleep apnea       Review Of Systems:  GEN- denies fatigue, fever, weight loss,weakness, recent illness HEENT- denies eye drainage, change in vision, nasal discharge, CVS- denies chest pain, palpitations RESP- denies SOB, cough, wheeze ABD- denies N/V, change in stools, abd pain GU- denies dysuria, hematuria, dribbling, incontinence MSK- denies joint pain, muscle aches, injury Neuro- denies headache, dizziness, syncope, seizure activity       Objective:    BP 132/70   Pulse 84   Temp 98.2 F (36.8 C) (Temporal)   Resp 16   Ht 5\' 9"  (1.753 m)   Wt 179 lb (81.2 kg)   SpO2 97%   BMI 26.43 kg/m  GEN- NAD, alert and oriented x3 HEENT- PERRL, EOMI, non injected sclera, pink conjunctiva, MMM, oropharynx clear Neck- Supple, no thyromegaly CVS- RRR, no murmur RESP-CTAB ABD-NABS,soft,NT,ND NEURO-CNII-XII in tact no focal deficits  EXT- No edema Pulses- Radial, DP- 2+        Assessment & Plan:      Problem List Items Addressed This Visit      Unprioritized   Essential hypertension, benign - Primary    Controlled no changes Fasting labs to be done at  wellness visit       OA (osteoarthritis) of knee    rollator with seat Lift chair prescription provided  Mattress overlay script provided       OSA (obstructive sleep apnea)    Followed by neurology        Other Visit Diagnoses    Gait instability          Note: This dictation was prepared with Dragon dictation along with smaller phrase technology. Any transcriptional errors that result from this process are unintentional.

## 2020-08-09 ENCOUNTER — Telehealth: Payer: Self-pay | Admitting: *Deleted

## 2020-08-09 NOTE — Telephone Encounter (Signed)
Received call from patient.   Reports that he was given prescription for following DME: Rollator with seat Lift chair  Mattress overlay   States that he contacted [facility] and was advised that PA is required. [phone]~ telephone.

## 2020-08-17 ENCOUNTER — Telehealth: Payer: Self-pay | Admitting: *Deleted

## 2020-08-17 MED ORDER — AMOXICILLIN 875 MG PO TABS: 875.0000 mg | ORAL_TABLET | Freq: Two times a day (BID) | ORAL | 0 refills | Status: AC

## 2020-08-17 NOTE — Telephone Encounter (Signed)
I dont recall discussing it with him He can use flonase and nasal saline, OTC allergy med Send in amoxicillin 875mg  BID x 7 days, if not improved needs another OV in person

## 2020-08-17 NOTE — Telephone Encounter (Addendum)
Received return call from patient.   Requested DME be supplied through [facility].  Orders sent.   Advised that [employer] does not fill chair lifts.

## 2020-08-17 NOTE — Telephone Encounter (Signed)
Call placed to patient.   Advised to send prescription to DME company. They will be able to assist with prescription.

## 2020-08-17 NOTE — Telephone Encounter (Signed)
Received call from patient.   Reports that he is having increased sinus issues. States that he discussed sinuses with PCP at last OV.   Reports that he has x2 months of frontal sinus pressure, HA, nasal congestion, and yellow colored nasal drainage.   Reports that he is using nasal saline and OTC sinus medications with no relief.   Denies SOB, cough, fever.   Requested ABTx.   MD please advise.

## 2020-08-17 NOTE — Telephone Encounter (Signed)
Prescription sent to pharmacy. .   Call placed to patient and patient made aware.  

## 2020-08-28 DIAGNOSIS — R413 Other amnesia: Secondary | ICD-10-CM | POA: Diagnosis not present

## 2020-08-28 DIAGNOSIS — G4733 Obstructive sleep apnea (adult) (pediatric): Secondary | ICD-10-CM | POA: Diagnosis not present

## 2020-09-01 DIAGNOSIS — R269 Unspecified abnormalities of gait and mobility: Secondary | ICD-10-CM | POA: Diagnosis not present

## 2020-09-01 DIAGNOSIS — M6281 Muscle weakness (generalized): Secondary | ICD-10-CM | POA: Diagnosis not present

## 2020-09-01 DIAGNOSIS — R262 Difficulty in walking, not elsewhere classified: Secondary | ICD-10-CM | POA: Diagnosis not present

## 2020-09-01 DIAGNOSIS — G4733 Obstructive sleep apnea (adult) (pediatric): Secondary | ICD-10-CM | POA: Diagnosis not present

## 2020-09-01 DIAGNOSIS — R413 Other amnesia: Secondary | ICD-10-CM | POA: Diagnosis not present

## 2020-09-27 DIAGNOSIS — R413 Other amnesia: Secondary | ICD-10-CM | POA: Diagnosis not present

## 2020-09-27 DIAGNOSIS — G4733 Obstructive sleep apnea (adult) (pediatric): Secondary | ICD-10-CM | POA: Diagnosis not present

## 2020-10-09 DIAGNOSIS — G4733 Obstructive sleep apnea (adult) (pediatric): Secondary | ICD-10-CM | POA: Diagnosis not present

## 2020-10-28 DIAGNOSIS — R413 Other amnesia: Secondary | ICD-10-CM | POA: Diagnosis not present

## 2020-10-28 DIAGNOSIS — G4733 Obstructive sleep apnea (adult) (pediatric): Secondary | ICD-10-CM | POA: Diagnosis not present

## 2020-11-27 DIAGNOSIS — R413 Other amnesia: Secondary | ICD-10-CM | POA: Diagnosis not present

## 2020-11-27 DIAGNOSIS — G4733 Obstructive sleep apnea (adult) (pediatric): Secondary | ICD-10-CM | POA: Diagnosis not present

## 2020-12-27 ENCOUNTER — Telehealth: Payer: Self-pay | Admitting: *Deleted

## 2020-12-27 NOTE — Telephone Encounter (Signed)
Received form from [employer] for sleep appliance to assist with OSA. Forms required to be completed by dentist.   Call placed to patient and advised that forms cannot be completed by PCP.   Forms faxed to [facility] [phone]~ telephone/ [phone]~ fax.

## 2021-01-09 ENCOUNTER — Telehealth: Payer: Self-pay | Admitting: *Deleted

## 2021-01-09 NOTE — Telephone Encounter (Signed)
Received call from patient.   Requested information on dental CPAP device. Advised that PCP does not complete orders for dental device. Advised that patient will need to see dental specialist or sleep specialist to have form completed.   Patient noted to be distressed and agitated. Patient hung up on Clinical research associate.   Forms, sleep study, and letter from PCP sent to patient via mail.

## 2021-01-30 ENCOUNTER — Encounter: Payer: Medicare Other | Admitting: Family Medicine

## 2021-03-13 ENCOUNTER — Encounter: Payer: Self-pay | Admitting: Internal Medicine

## 2021-03-13 ENCOUNTER — Other Ambulatory Visit: Payer: Self-pay

## 2021-03-13 ENCOUNTER — Ambulatory Visit (INDEPENDENT_AMBULATORY_CARE_PROVIDER_SITE_OTHER): Payer: Medicare Other | Admitting: Internal Medicine

## 2021-03-13 VITALS — BP 140/81 | HR 78 | Resp 18 | Ht 69.0 in | Wt 181.1 lb

## 2021-03-13 DIAGNOSIS — Z889 Allergy status to unspecified drugs, medicaments and biological substances status: Secondary | ICD-10-CM | POA: Diagnosis not present

## 2021-03-13 DIAGNOSIS — E782 Mixed hyperlipidemia: Secondary | ICD-10-CM

## 2021-03-13 DIAGNOSIS — R413 Other amnesia: Secondary | ICD-10-CM | POA: Diagnosis not present

## 2021-03-13 DIAGNOSIS — G4733 Obstructive sleep apnea (adult) (pediatric): Secondary | ICD-10-CM | POA: Diagnosis not present

## 2021-03-13 DIAGNOSIS — Z125 Encounter for screening for malignant neoplasm of prostate: Secondary | ICD-10-CM

## 2021-03-13 DIAGNOSIS — Z7689 Persons encountering health services in other specified circumstances: Secondary | ICD-10-CM

## 2021-03-13 DIAGNOSIS — I10 Essential (primary) hypertension: Secondary | ICD-10-CM | POA: Diagnosis not present

## 2021-03-13 DIAGNOSIS — N529 Male erectile dysfunction, unspecified: Secondary | ICD-10-CM

## 2021-03-13 NOTE — Patient Instructions (Signed)
Please continue to take medications as prescribed.  Please get fasting blood tests done before the next visit. 

## 2021-03-13 NOTE — Assessment & Plan Note (Signed)
Care established Previous chart reviewed History and medications reviewed with the patient 

## 2021-03-13 NOTE — Assessment & Plan Note (Signed)
On Namenda 

## 2021-03-13 NOTE — Assessment & Plan Note (Signed)
Takes Sildenafil PRN ?

## 2021-03-13 NOTE — Assessment & Plan Note (Signed)
BP Readings from Last 1 Encounters:  03/13/21 140/81   Well-controlled with Amlodipine 5 mg QD - considering his age Counseled for compliance with the medications Advised DASH diet and moderate exercise/walking, at least 150 mins/week

## 2021-03-13 NOTE — Progress Notes (Addendum)
New Patient Office Visit  Subjective:  Patient ID: Chris Taylor, male    DOB: [birth year]  Age: 80 y.o. MRN: [id number]  CC:  Chief Complaint  Patient presents with  . New Patient (Initial Visit)    New patient just establishing care     HPI Chris Taylor is a 80 year old male with PMH of HTN, OSA on CPAP, HLD and mild cognitive impairment who presents for establishing care. He is a former patient of Dr Chris Taylor.  He has been doing well overall.  His BP was mildly high today. Takes medications regularly. Patient denies headache, dizziness, chest pain, dyspnea or palpitations.  He uses CPAP regularly for OSA. He has been having difficulty with the CPAP device, and it disturbs his sleep. He is visiting a sleep specialist/Dentist for evaluation of dental appliance for OSA.  He is up-to-date with COVID and flu vaccine.  Past Medical History:  Diagnosis Date  . Arthritis   . Family history of heart disease   . Forgetfulness   . Hemorrhoids   . History of nuclear stress test 1996   negative  . Sleep apnea     Past Surgical History:  Procedure Laterality Date  . CATARACT EXTRACTION, BILATERAL    . HEMORRHOID SURGERY N/A 05/17/2013   Procedure: HEMORRHOIDECTOMY;  Surgeon: Chris Heinz, MD;  Location: [facility];  Service: General;  Laterality: N/A;  . INGUINAL HERNIA REPAIR Left 02/05/2017   Procedure: REPAIR LEFT INGUINAL HERNIA;  Surgeon: Chris Skeans, MD;  Location: [facility];  Service: General;  Laterality: Left;  . INSERTION OF MESH Left 02/05/2017   Procedure: INSERTION OF MESH;  Surgeon: Chris Skeans, MD;  Location: [place] [facility];  Service: General;  Laterality: Left;  . KNEE ARTHROSCOPY Right   . KNEE ARTHROSCOPY Left     Family History  Problem Relation Age of Onset  . Heart disease Mother        MI @ 41, stroke  . Stroke Mother   . Heart disease Father        MI @ 82, CABG  . Heart attack Brother   . Heart attack Brother     Social History   Socioeconomic  History  . Marital status: Divorced    Spouse name: Not on file  . Number of children: 2  . Years of education: Not on file  . Highest education level: Not on file  Occupational History  . Occupation: retired Advertising account planner: RETIRED    Comment: [employer]  Tobacco Use  . Smoking status: Former Smoker    Years: 6.00    Quit date: 10/20/1983    Years since quitting: 37.4  . Smokeless tobacco: Never Used  Vaping Use  . Vaping Use: Never used  Substance and Sexual Activity  . Alcohol use: Yes    Alcohol/week: 1.0 standard drink    Types: 1 Glasses of wine per week    Comment: drinks 1/2 glass of wine with dinner  . Drug use: No  . Sexual activity: Never  Other Topics Concern  . Not on file  Social History Narrative   Lives in [place], [place].   Live alone.    Divorced. 2 children, Live in [place].   Not dating anyone.   Eats all food groups.   Wears seatbelt.    Social Determinants of Health   Financial Resource Strain: Not on file  Food Insecurity: Not on file  Transportation Needs: Not on file  Physical Activity: Not on file  Stress: Not on file  Social Connections: Not on file  Intimate Partner Violence: Not on file    ROS Review of Systems  Constitutional: Negative for chills and fever.  HENT: Negative for congestion and sore throat.   Eyes: Negative for pain and discharge.  Respiratory: Negative for cough and shortness of breath.   Cardiovascular: Negative for chest pain and palpitations.  Gastrointestinal: Negative for constipation, diarrhea, nausea and vomiting.  Endocrine: Negative for polydipsia and polyuria.  Genitourinary: Negative for dysuria and hematuria.  Musculoskeletal: Positive for arthralgias (knee). Negative for neck pain and neck stiffness.  Skin: Negative for rash.  Neurological: Negative for dizziness, weakness, numbness and headaches.  Psychiatric/Behavioral: Negative for agitation and behavioral problems.    Objective:    Today's Vitals: BP 140/81 (BP Location: Right Arm, Patient Position: Sitting, Cuff Size: Normal)   Pulse 78   Resp 18   Ht _0  (1.753 m)   Wt 181 lb 1.9 oz (82.2 kg)   SpO2 98%   BMI 26.75 kg/m   Physical Exam Vitals reviewed.  Constitutional:      General: He is not in acute distress.    Appearance: He is not diaphoretic.  HENT:     Head: Normocephalic and atraumatic.     Nose: Nose normal.     Mouth/Throat:     Mouth: Mucous membranes are moist.  Eyes:     General: No scleral icterus.    Extraocular Movements: Extraocular movements intact.     Pupils: Pupils are equal, round, and reactive to light.  Cardiovascular:     Rate and Rhythm: Normal rate and regular rhythm.     Pulses: Normal pulses.     Heart sounds: Normal heart sounds. No murmur heard.   Pulmonary:     Breath sounds: Normal breath sounds. No wheezing or rales.  Abdominal:     Palpations: Abdomen is soft.     Tenderness: There is no abdominal tenderness.  Musculoskeletal:     Cervical back: Neck supple. No tenderness.     Right lower leg: No edema.     Left lower leg: No edema.  Skin:    General: Skin is warm.     Findings: No rash.  Neurological:     General: No focal deficit present.     Mental Status: He is alert and oriented to person, place, and time.  Psychiatric:        Mood and Affect: Mood normal.        Behavior: Behavior normal.     Assessment & Plan:   Problem List Items Addressed This Visit      Cardiovascular and Mediastinum   Essential hypertension, benign    BP Readings from Last 1 Encounters:  03/13/21 140/81   Well-controlled with Amlodipine 5 mg QD - considering his age 60 for compliance with the medications Advised DASH diet and moderate exercise/walking, at least 150 mins/week       Relevant Orders   CBC with Differential     Respiratory   OSA (obstructive sleep apnea)    Uses CPAP device at nighttime - though has difficulty sleeping with  it Undergoing dental appliance evaluation      Relevant Orders   CMP14+EGFR     Other   Memory impairment    On Namenda      Mixed hyperlipidemia    On statin Will check lipid profile in the next visit  Relevant Orders   Lipid Profile   Hemoglobin A1c   ED (erectile dysfunction)    Takes Sildenafil PRN      Hx of seasonal allergies    On Claritin      Encounter to establish care - Primary    Care established Previous chart reviewed History and medications reviewed with the patient      Relevant Orders   CBC with Differential   CMP14+EGFR   Hemoglobin A1c   TSH   Vitamin D (25 hydroxy)    Other Visit Diagnoses    Prostate cancer screening       Relevant Orders   PSA      Outpatient Encounter Medications as of 03/13/2021  Medication Sig  . albuterol (VENTOLIN HFA) 108 (90 Base) MCG/ACT inhaler Inhale 2 puffs into the lungs every 6 (six) hours as needed for wheezing or shortness of breath.  Marland Kitchen amLODipine (NORVASC) 5 MG tablet TAKE 1 TABLET BY MOUTH  DAILY  . aspirin EC 81 MG tablet Take 81 mg by mouth daily.  . Cod Liver Oil OIL Take 15 mLs by mouth daily.  . Coenzyme Q10 (COQ10) 200 MG CAPS Take 200 mg by mouth 3 (three) times a week.  . fluticasone (FLONASE) 50 MCG/ACT nasal spray Place 2 sprays into both nostrils daily.  . Garlic 9381 MG CAPS Take 1,000 mg by mouth 3 (three) times a week.  . Ginkgo 60 MG TABS Take 60 mg by mouth 3 (three) times a week.   . hydrOXYzine (ATARAX/VISTARIL) 10 MG tablet Take 1 tablet (10 mg total) by mouth 3 (three) times daily as needed for itching.  . loratadine (CLARITIN) 10 MG tablet Take 1 tablet (10 mg total) by mouth daily.  . memantine (NAMENDA) 10 MG tablet TAKE 1 TABLET BY MOUTH  DAILY  . Multiple Vitamin (MULTIVITAMIN WITH MINERALS) TABS tablet Take 1 tablet by mouth daily.  . sildenafil (VIAGRA) 50 MG tablet Take 1 tablet (50 mg total) by mouth daily as needed for erectile dysfunction.  . simvastatin (ZOCOR) 20  MG tablet TAKE 1 TABLET BY MOUTH AT  BEDTIME  . zinc gluconate 50 MG tablet Take 50 mg by mouth 3 (three) times a week.   No facility-administered encounter medications on file as of 03/13/2021.    Follow-up: Return in about 4 weeks (around 04/10/2021) for Annual physical.   Lindell Spar, MD

## 2021-03-13 NOTE — Assessment & Plan Note (Addendum)
Uses CPAP device at nighttime - though has difficulty sleeping with it Undergoing dental appliance evaluation

## 2021-03-13 NOTE — Assessment & Plan Note (Signed)
On statin Will check lipid profile in the next visit

## 2021-03-13 NOTE — Assessment & Plan Note (Signed)
On Claritin 

## 2021-03-16 ENCOUNTER — Encounter: Payer: Self-pay | Admitting: *Deleted

## 2021-04-12 ENCOUNTER — Other Ambulatory Visit: Payer: Self-pay

## 2021-04-12 ENCOUNTER — Ambulatory Visit (INDEPENDENT_AMBULATORY_CARE_PROVIDER_SITE_OTHER): Payer: Medicare Other | Admitting: Internal Medicine

## 2021-04-12 ENCOUNTER — Encounter: Payer: Self-pay | Admitting: Internal Medicine

## 2021-04-12 VITALS — BP 146/78 | HR 72 | Temp 98.3°F | Resp 18 | Ht 69.0 in | Wt 179.0 lb

## 2021-04-12 DIAGNOSIS — I10 Essential (primary) hypertension: Secondary | ICD-10-CM | POA: Diagnosis not present

## 2021-04-12 DIAGNOSIS — E782 Mixed hyperlipidemia: Secondary | ICD-10-CM | POA: Diagnosis not present

## 2021-04-12 DIAGNOSIS — Z0000 Encounter for general adult medical examination without abnormal findings: Secondary | ICD-10-CM

## 2021-04-12 DIAGNOSIS — R413 Other amnesia: Secondary | ICD-10-CM

## 2021-04-12 DIAGNOSIS — G4733 Obstructive sleep apnea (adult) (pediatric): Secondary | ICD-10-CM | POA: Diagnosis not present

## 2021-04-12 DIAGNOSIS — Z7689 Persons encountering health services in other specified circumstances: Secondary | ICD-10-CM | POA: Diagnosis not present

## 2021-04-12 DIAGNOSIS — E559 Vitamin D deficiency, unspecified: Secondary | ICD-10-CM | POA: Diagnosis not present

## 2021-04-12 NOTE — Assessment & Plan Note (Signed)
BP Readings from Last 1 Encounters:  04/12/21 (!) 146/78   Well-controlled with Amlodipine 5 mg QD - considering his age Counseled for compliance with the medications Advised DASH diet and moderate exercise/walking, at least 150 mins/week

## 2021-04-12 NOTE — Progress Notes (Signed)
Established Patient Office Visit  Subjective:  Patient ID: Chris Taylor, male    DOB: 12/29/1940  Age: 80 y.o. MRN: [id number]  CC:  Chief Complaint  Patient presents with  . Annual Exam    Annual exam would like protein in urine checked also is fasting for lab work     HPI Chris Taylor  is a 80 year old male with PMH of HTN, OSA on CPAP, HLD and mild cognitive impairment who presents for annual physical.  His BP was elevated today, but he has not had his Amlodipine today as he was fasting for blood tests. He denies any headache, dizziness, chest pain, dyspnea or palpitations.  He recently had evaluation for dental appliance for OSA as he is having difficulty with CPAP device.  Past Medical History:  Diagnosis Date  . Arthritis   . Family history of heart disease   . Forgetfulness   . Hemorrhoids   . History of nuclear stress test 1996   negative  . Sleep apnea     Past Surgical History:  Procedure Laterality Date  . CATARACT EXTRACTION, BILATERAL    . HEMORRHOID SURGERY N/A 05/17/2013   Procedure: HEMORRHOIDECTOMY;  Surgeon: Fabio Bering, MD;  Location: [facility];  Service: General;  Laterality: N/A;  . INGUINAL HERNIA REPAIR Left 02/05/2017   Procedure: REPAIR LEFT INGUINAL HERNIA;  Surgeon: Violeta Gelinas, MD;  Location: [facility];  Service: General;  Laterality: Left;  . INSERTION OF MESH Left 02/05/2017   Procedure: INSERTION OF MESH;  Surgeon: Violeta Gelinas, MD;  Location: [facility];  Service: General;  Laterality: Left;  . KNEE ARTHROSCOPY Right   . KNEE ARTHROSCOPY Left     Family History  Problem Relation Age of Onset  . Heart disease Mother        MI @ 65, stroke  . Stroke Mother   . Heart disease Father        MI @ 34, CABG  . Heart attack Brother   . Heart attack Brother     Social History   Socioeconomic History  . Marital status: Divorced    Spouse name: Not on file  . Number of children: 2  . Years of education: Not on file  . Highest  education level: Not on file  Occupational History  . Occupation: retired Producer, television/film/video: RETIRED    Comment: [employer]  Tobacco Use  . Smoking status: Former Smoker    Years: 6.00    Quit date: 10/20/1983    Years since quitting: 37.5  . Smokeless tobacco: Never Used  Vaping Use  . Vaping Use: Never used  Substance and Sexual Activity  . Alcohol use: Yes    Alcohol/week: 1.0 standard drink    Types: 1 Glasses of wine per week    Comment: drinks 1/2 glass of wine with dinner  . Drug use: No  . Sexual activity: Never  Other Topics Concern  . Not on file  Social History Narrative   Lives in [place], [place].   Live alone.    Divorced. 2 children, Live in [place].   Not dating anyone.   Eats all food groups.   Wears seatbelt.    Social Determinants of Health   Financial Resource Strain: Not on file  Food Insecurity: Not on file  Transportation Needs: Not on file  Physical Activity: Not on file  Stress: Not on file  Social Connections: Not on file  Intimate Partner Violence:  Not on file    Outpatient Medications Prior to Visit  Medication Sig Dispense Refill  . albuterol (VENTOLIN HFA) 108 (90 Base) MCG/ACT inhaler Inhale 2 puffs into the lungs every 6 (six) hours as needed for wheezing or shortness of breath. 8 g 0  . amLODipine (NORVASC) 5 MG tablet TAKE 1 TABLET BY MOUTH  DAILY 90 tablet 3  . aspirin EC 81 MG tablet Take 81 mg by mouth daily.    . Cod Liver Oil OIL Take 15 mLs by mouth daily.    . Coenzyme Q10 (COQ10) 200 MG CAPS Take 200 mg by mouth 3 (three) times a week.    . fluticasone (FLONASE) 50 MCG/ACT nasal spray Place 2 sprays into both nostrils daily. 16 g 6  . Garlic 1000 MG CAPS Take 1,000 mg by mouth 3 (three) times a week.    . Ginkgo 60 MG TABS Take 60 mg by mouth 3 (three) times a week.     . hydrOXYzine (ATARAX/VISTARIL) 10 MG tablet Take 1 tablet (10 mg total) by mouth 3 (three) times daily as needed for itching. 30 tablet 0  .  loratadine (CLARITIN) 10 MG tablet Take 1 tablet (10 mg total) by mouth daily. 90 tablet 3  . memantine (NAMENDA) 10 MG tablet TAKE 1 TABLET BY MOUTH  DAILY 90 tablet 3  . Multiple Vitamin (MULTIVITAMIN WITH MINERALS) TABS tablet Take 1 tablet by mouth daily.    . sildenafil (VIAGRA) 50 MG tablet Take 1 tablet (50 mg total) by mouth daily as needed for erectile dysfunction. 60 tablet 0  . simvastatin (ZOCOR) 20 MG tablet TAKE 1 TABLET BY MOUTH AT  BEDTIME 90 tablet 3  . zinc gluconate 50 MG tablet Take 50 mg by mouth 3 (three) times a week.     No facility-administered medications prior to visit.    Allergies  Allergen Reactions  . No Known Allergies     ROS Review of Systems  Constitutional: Negative for chills and fever.  HENT: Negative for congestion and sore throat.   Eyes: Negative for pain and discharge.  Respiratory: Negative for cough and shortness of breath.   Cardiovascular: Negative for chest pain and palpitations.  Gastrointestinal: Negative for constipation, diarrhea, nausea and vomiting.  Endocrine: Negative for polydipsia and polyuria.  Genitourinary: Negative for dysuria and hematuria.  Musculoskeletal: Positive for arthralgias (knee). Negative for neck pain and neck stiffness.  Skin: Negative for rash.  Neurological: Negative for dizziness, weakness, numbness and headaches.  Psychiatric/Behavioral: Negative for agitation and behavioral problems.      Objective:    Physical Exam Vitals reviewed.  Constitutional:      General: He is not in acute distress.    Appearance: He is not diaphoretic.  HENT:     Head: Normocephalic and atraumatic.     Nose: Nose normal.     Mouth/Throat:     Mouth: Mucous membranes are moist.  Eyes:     General: No scleral icterus.    Extraocular Movements: Extraocular movements intact.     Pupils: Pupils are equal, round, and reactive to light.  Neck:     Vascular: No carotid bruit.  Cardiovascular:     Rate and Rhythm:  Normal rate and regular rhythm.     Pulses: Normal pulses.     Heart sounds: Normal heart sounds. No murmur heard.   Pulmonary:     Breath sounds: Normal breath sounds. No wheezing or rales.  Abdominal:     Palpations:  Abdomen is soft.     Tenderness: There is no abdominal tenderness.  Musculoskeletal:     Cervical back: Neck supple. No tenderness.     Right lower leg: No edema.     Left lower leg: No edema.  Skin:    General: Skin is warm.     Findings: No rash.  Neurological:     General: No focal deficit present.     Mental Status: He is alert and oriented to person, place, and time.     Cranial Nerves: No cranial nerve deficit.     Sensory: No sensory deficit.     Motor: No weakness.  Psychiatric:        Mood and Affect: Mood normal.        Behavior: Behavior normal.     BP (!) 146/78 (BP Location: Right Arm, Patient Position: Sitting, Cuff Size: Normal)   Pulse 72   Temp 98.3 F (36.8 C) (Oral)   Resp 18   Ht 5\' 9"  (1.753 m)   Wt 179 lb (81.2 kg)   SpO2 98%   BMI 26.43 kg/m  Wt Readings from Last 3 Encounters:  04/12/21 179 lb (81.2 kg)  03/13/21 181 lb 1.9 oz (82.2 kg)  08/07/20 179 lb (81.2 kg)     There are no preventive care reminders to display for this patient.  There are no preventive care reminders to display for this patient.  Lab Results  Component Value Date   TSH 2.07 05/01/2018   Lab Results  Component Value Date   WBC 4.3 11/19/2019   HGB 14.9 11/19/2019   HCT 45.1 11/19/2019   MCV 98.3 11/19/2019   PLT 202 11/19/2019   Lab Results  Component Value Date   NA 140 11/19/2019   K 3.8 11/19/2019   CO2 24 11/19/2019   GLUCOSE 93 11/19/2019   BUN 12 11/19/2019   CREATININE 1.15 11/19/2019   BILITOT 0.8 11/19/2019   AST 36 (H) 11/19/2019   ALT 28 11/19/2019   PROT 7.0 11/19/2019   CALCIUM 9.3 11/19/2019   Lab Results  Component Value Date   CHOL 189 11/19/2019   Lab Results  Component Value Date   HDL 80 11/19/2019   Lab  Results  Component Value Date   LDLCALC 94 11/19/2019   Lab Results  Component Value Date   TRIG 67 11/19/2019   Lab Results  Component Value Date   CHOLHDL 2.4 11/19/2019   Lab Results  Component Value Date   HGBA1C 5.5 03/31/2018      Assessment & Plan:   Problem List Items Addressed This Visit      Annual physical exam    -  Primary Annual exam as documented. Counseling done  re healthy lifestyle involving commitment to 150 minutes exercise per week, heart healthy diet, and attaining healthy weight.The importance of adequate sleep also discussed. Changes in health habits are decided on by the patient with goals and time frames  set for achieving them. Immunization and cancer screening needs are specifically addressed at this visit.    Cardiovascular and Mediastinum   Essential hypertension, benign    BP Readings from Last 1 Encounters:  04/12/21 (!) 146/78   Well-controlled with Amlodipine 5 mg QD - considering his age Counseled for compliance with the medications Advised DASH diet and moderate exercise/walking, at least 150 mins/week      Relevant Orders   Urinalysis     Respiratory   OSA (obstructive sleep apnea)  Uses CPAP device at nighttime - though has difficulty sleeping with it Undergoing dental appliance evaluation        Other   Memory impairment    On Namenda       No orders of the defined types were placed in this encounter.   Follow-up: Return in about 6 months (around 10/12/2021).    Anabel Halon, MD

## 2021-04-12 NOTE — Assessment & Plan Note (Signed)
Uses CPAP device at nighttime - though has difficulty sleeping with it Undergoing dental appliance evaluation

## 2021-04-12 NOTE — Assessment & Plan Note (Signed)
On Namenda 

## 2021-04-12 NOTE — Patient Instructions (Signed)
Please continue taking medications as prescribed.  Please continue to follow low salt diet and ambulate as tolerated.  Please continue to use CPAP device until you receive dental appliance for sleep apnea.

## 2021-04-13 LAB — CBC WITH DIFFERENTIAL/PLATELET
Basophils Absolute: 0.1 10*3/uL (ref 0.0–0.2)
Basos: 1 %
EOS (ABSOLUTE): 0.4 10*3/uL (ref 0.0–0.4)
Eos: 10 %
Hematocrit: 45.9 % (ref 37.5–51.0)
Hemoglobin: 15.6 g/dL (ref 13.0–17.7)
Immature Grans (Abs): 0 10*3/uL (ref 0.0–0.1)
Immature Granulocytes: 0 %
Lymphocytes Absolute: 1.7 10*3/uL (ref 0.7–3.1)
Lymphs: 39 %
MCH: 32.4 pg (ref 26.6–33.0)
MCHC: 34 g/dL (ref 31.5–35.7)
MCV: 95 fL (ref 79–97)
Monocytes Absolute: 0.4 10*3/uL (ref 0.1–0.9)
Monocytes: 8 %
Neutrophils Absolute: 1.8 10*3/uL (ref 1.4–7.0)
Neutrophils: 42 %
Platelets: 181 10*3/uL (ref 150–450)
RBC: 4.81 x10E6/uL (ref 4.14–5.80)
RDW: 12.3 % (ref 11.6–15.4)
WBC: 4.3 10*3/uL (ref 3.4–10.8)

## 2021-04-13 LAB — LIPID PANEL
Chol/HDL Ratio: 2.1 ratio (ref 0.0–5.0)
Cholesterol, Total: 185 mg/dL (ref 100–199)
HDL: 88 mg/dL (ref >39)
LDL Chol Calc (NIH): 86 mg/dL (ref 0–99)
Triglycerides: 56 mg/dL (ref 0–149)
VLDL Cholesterol Cal: 11 mg/dL (ref 5–40)

## 2021-04-13 LAB — HEMOGLOBIN A1C
Est. average glucose Bld gHb Est-mCnc: 111 mg/dL
Hgb A1c MFr Bld: 5.5 % (ref 4.8–5.6)

## 2021-04-13 LAB — URINALYSIS
Bilirubin, UA: NEGATIVE
Glucose, UA: NEGATIVE
Ketones, UA: NEGATIVE
Leukocytes,UA: NEGATIVE
Nitrite, UA: NEGATIVE
RBC, UA: NEGATIVE
Specific Gravity, UA: 1.018 (ref 1.005–1.030)
Urobilinogen, Ur: 0.2 mg/dL (ref 0.2–1.0)
pH, UA: 8 — ABNORMAL HIGH (ref 5.0–7.5)

## 2021-04-13 LAB — PSA: Prostate Specific Ag, Serum: 1.7 ng/mL (ref 0.0–4.0)

## 2021-04-13 LAB — CMP14+EGFR
ALT: 32 IU/L (ref 0–44)
AST: 40 IU/L (ref 0–40)
Albumin/Globulin Ratio: 1.8 (ref 1.2–2.2)
Albumin: 4.7 g/dL (ref 3.7–4.7)
Alkaline Phosphatase: 69 IU/L (ref 44–121)
BUN/Creatinine Ratio: 10 (ref 10–24)
BUN: 11 mg/dL (ref 8–27)
Bilirubin Total: 0.6 mg/dL (ref 0.0–1.2)
CO2: 22 mmol/L (ref 20–29)
Calcium: 9.2 mg/dL (ref 8.6–10.2)
Chloride: 104 mmol/L (ref 96–106)
Creatinine, Ser: 1.06 mg/dL (ref 0.76–1.27)
Globulin, Total: 2.6 g/dL (ref 1.5–4.5)
Glucose: 98 mg/dL (ref 65–99)
Potassium: 4.4 mmol/L (ref 3.5–5.2)
Sodium: 142 mmol/L (ref 134–144)
Total Protein: 7.3 g/dL (ref 6.0–8.5)
eGFR: 71 mL/min/{1.73_m2} (ref >59)

## 2021-04-13 LAB — VITAMIN D 25 HYDROXY (VIT D DEFICIENCY, FRACTURES): Vit D, 25-Hydroxy: 80 ng/mL (ref 30.0–100.0)

## 2021-04-13 LAB — TSH: TSH: 3.01 u[IU]/mL (ref 0.450–4.500)

## 2021-04-22 ENCOUNTER — Other Ambulatory Visit: Payer: Self-pay | Admitting: Family Medicine

## 2021-05-17 DIAGNOSIS — G4733 Obstructive sleep apnea (adult) (pediatric): Secondary | ICD-10-CM | POA: Diagnosis not present

## 2021-06-21 ENCOUNTER — Telehealth: Payer: Self-pay

## 2021-06-21 NOTE — Telephone Encounter (Signed)
Per ashley craddock  no prescription would be sent unless patient was symptomatic with a positive test.     Me      2:33 PM Note Pt states that the pharmacy told him that he could get the covid pill , I advised he couldn't get the pill without having covid, he wanted me to ask

## 2021-07-05 ENCOUNTER — Other Ambulatory Visit: Payer: Self-pay | Admitting: *Deleted

## 2021-07-05 DIAGNOSIS — E782 Mixed hyperlipidemia: Secondary | ICD-10-CM

## 2021-07-05 MED ORDER — SIMVASTATIN 20 MG PO TABS: 20.0000 mg | ORAL_TABLET | Freq: Every day | ORAL | 3 refills | Status: DC

## 2021-07-11 ENCOUNTER — Other Ambulatory Visit: Payer: Self-pay

## 2021-07-11 ENCOUNTER — Ambulatory Visit (INDEPENDENT_AMBULATORY_CARE_PROVIDER_SITE_OTHER): Payer: Medicare Other | Admitting: *Deleted

## 2021-07-11 DIAGNOSIS — Z0000 Encounter for general adult medical examination without abnormal findings: Secondary | ICD-10-CM | POA: Diagnosis not present

## 2021-07-11 NOTE — Patient Instructions (Signed)
[name] , Thank you for taking time to come for your Medicare Wellness Visit. I appreciate your ongoing commitment to your health goals. Please review the following plan we discussed and let me know if I can assist you in the future.   Screening recommendations/referrals:  Recommended yearly ophthalmology/optometry visit for glaucoma screening and checkup Recommended yearly dental visit for hygiene and checkup  Vaccinations: Influenza vaccine: Completed Due 07-16-21 Pneumococcal vaccine: Completed Tdap vaccine: Completed Due 10-24-27  Shingles vaccine: Due now     Advanced directives: Information provided   Conditions/risks identified: Hypertension  Next appointment: 1 year   Preventive Care 80 Years and Older, Male Preventive care refers to lifestyle choices and visits with your health care provider that can promote health and wellness. What does preventive care include? A yearly physical exam. This is also called an annual well check. Dental exams once or twice a year. Routine eye exams. Ask your health care provider how often you should have your eyes checked. Personal lifestyle choices, including: Daily care of your teeth and gums. Regular physical activity. Eating a healthy diet. Avoiding tobacco and drug use. Limiting alcohol use. Practicing safe sex. Taking low doses of aspirin every day. Taking vitamin and mineral supplements as recommended by your health care provider. What happens during an annual well check? The services and screenings done by your health care provider during your annual well check will depend on your age, overall health, lifestyle risk factors, and family history of disease. Counseling  Your health care provider may ask you questions about your: Alcohol use. Tobacco use. Drug use. Emotional well-being. Home and relationship well-being. Sexual activity. Eating habits. History of falls. Memory and ability to understand (cognition). Work and  work Astronomer. Screening  You may have the following tests or measurements: Height, weight, and BMI. Blood pressure. Lipid and cholesterol levels. These may be checked every 5 years, or more frequently if you are over 90 years old. Skin check. Lung cancer screening. You may have this screening every year starting at age 69 if you have a 30-pack-year history of smoking and currently smoke or have quit within the past 15 years. Fecal occult blood test (FOBT) of the stool. You may have this test every year starting at age 54. Flexible sigmoidoscopy or colonoscopy. You may have a sigmoidoscopy every 5 years or a colonoscopy every 10 years starting at age 30. Prostate cancer screening. Recommendations will vary depending on your family history and other risks. Hepatitis C blood test. Hepatitis B blood test. Sexually transmitted disease (STD) testing. Diabetes screening. This is done by checking your blood sugar (glucose) after you have not eaten for a while (fasting). You may have this done every 1-3 years. Abdominal aortic aneurysm (AAA) screening. You may need this if you are a current or former smoker. Osteoporosis. You may be screened starting at age 3 if you are at high risk. Talk with your health care provider about your test results, treatment options, and if necessary, the need for more tests. Vaccines  Your health care provider may recommend certain vaccines, such as: Influenza vaccine. This is recommended every year. Tetanus, diphtheria, and acellular pertussis (Tdap, Td) vaccine. You may need a Td booster every 10 years. Zoster vaccine. You may need this after age 80. Pneumococcal 13-valent conjugate (PCV13) vaccine. One dose is recommended after age 58. Pneumococcal polysaccharide (PPSV23) vaccine. One dose is recommended after age 63. Talk to your health care provider about which screenings and vaccines you need and  how often you need them. This information is not intended to  replace advice given to you by your health care provider. Make sure you discuss any questions you have with your health care provider. Document Released: 12/29/2015 Document Revised: 08/21/2016 Document Reviewed: 10/03/2015 Elsevier Interactive Patient Education  2017 [place] Prevention in the Home Falls can cause injuries. They can happen to people of all ages. There are many things you can do to make your home safe and to help prevent falls. What can I do on the outside of my home? Regularly fix the edges of walkways and driveways and fix any cracks. Remove anything that might make you trip as you walk through a door, such as a raised step or threshold. Trim any bushes or trees on the path to your home. Use bright outdoor lighting. Clear any walking paths of anything that might make someone trip, such as rocks or tools. Regularly check to see if handrails are loose or broken. Make sure that both sides of any steps have handrails. Any raised decks and porches should have guardrails on the edges. Have any leaves, snow, or ice cleared regularly. Use sand or salt on walking paths during winter. Clean up any spills in your garage right away. This includes oil or grease spills. What can I do in the bathroom? Use night lights. Install grab bars by the toilet and in the tub and shower. Do not use towel bars as grab bars. Use non-skid mats or decals in the tub or shower. If you need to sit down in the shower, use a plastic, non-slip stool. Keep the floor dry. Clean up any water that spills on the floor as soon as it happens. Remove soap buildup in the tub or shower regularly. Attach bath mats securely with double-sided non-slip rug tape. Do not have throw rugs and other things on the floor that can make you trip. What can I do in the bedroom? Use night lights. Make sure that you have a light by your bed that is easy to reach. Do not use any sheets or blankets that are too big for  your bed. They should not hang down onto the floor. Have a firm chair that has side arms. You can use this for support while you get dressed. Do not have throw rugs and other things on the floor that can make you trip. What can I do in the kitchen? Clean up any spills right away. Avoid walking on wet floors. Keep items that you use a lot in easy-to-reach places. If you need to reach something above you, use a strong step stool that has a grab bar. Keep electrical cords out of the way. Do not use floor polish or wax that makes floors slippery. If you must use wax, use non-skid floor wax. Do not have throw rugs and other things on the floor that can make you trip. What can I do with my stairs? Do not leave any items on the stairs. Make sure that there are handrails on both sides of the stairs and use them. Fix handrails that are broken or loose. Make sure that handrails are as long as the stairways. Check any carpeting to make sure that it is firmly attached to the stairs. Fix any carpet that is loose or worn. Avoid having throw rugs at the top or bottom of the stairs. If you do have throw rugs, attach them to the floor with carpet tape. Make sure that you have  a light switch at the top of the stairs and the bottom of the stairs. If you do not have them, ask someone to add them for you. What else can I do to help prevent falls? Wear shoes that: Do not have high heels. Have rubber bottoms. Are comfortable and fit you well. Are closed at the toe. Do not wear sandals. If you use a stepladder: Make sure that it is fully opened. Do not climb a closed stepladder. Make sure that both sides of the stepladder are locked into place. Ask someone to hold it for you, if possible. Clearly mark and make sure that you can see: Any grab bars or handrails. First and last steps. Where the edge of each step is. Use tools that help you move around (mobility aids) if they are needed. These  include: Canes. Walkers. Scooters. Crutches. Turn on the lights when you go into a dark area. Replace any light bulbs as soon as they burn out. Set up your furniture so you have a clear path. Avoid moving your furniture around. If any of your floors are uneven, fix them. If there are any pets around you, be aware of where they are. Review your medicines with your doctor. Some medicines can make you feel dizzy. This can increase your chance of falling. Ask your doctor what other things that you can do to help prevent falls. This information is not intended to replace advice given to you by your health care provider. Make sure you discuss any questions you have with your health care provider. Document Released: 09/28/2009 Document Revised: 05/09/2016 Document Reviewed: 01/06/2015 Elsevier Interactive Patient Education  2017 [employer].

## 2021-07-11 NOTE — Progress Notes (Signed)
Subjective:   Chris Taylor is a 80 y.o. male who presents for Medicare Annual/Subsequent preventive examination.  Review of Systems           Objective:    There were no vitals filed for this visit. There is no height or weight on file to calculate BMI.  Advanced Directives 02/05/2017 01/29/2017 05/14/2013  Does Patient Have a Medical Advance Directive? No No Patient does not have advance directive;Patient would not like information  Would patient like information on creating a medical advance directive? No - Patient declined No - Patient declined -  Pre-existing out of facility DNR order (yellow form or pink MOST form) - - No    Current Medications (verified) Outpatient Encounter Medications as of 07/11/2021  Medication Sig   albuterol (VENTOLIN HFA) 108 (90 Base) MCG/ACT inhaler Inhale 2 puffs into the lungs every 6 (six) hours as needed for wheezing or shortness of breath.   amLODipine (NORVASC) 5 MG tablet TAKE 1 TABLET BY MOUTH  DAILY   aspirin EC 81 MG tablet Take 81 mg by mouth daily.   Cod Liver Oil OIL Take 15 mLs by mouth daily.   Coenzyme Q10 (COQ10) 200 MG CAPS Take 200 mg by mouth 3 (three) times a week.   fluticasone (FLONASE) 50 MCG/ACT nasal spray Place 2 sprays into both nostrils daily.   Garlic 1000 MG CAPS Take 1,000 mg by mouth 3 (three) times a week.   Ginkgo 60 MG TABS Take 60 mg by mouth 3 (three) times a week.    hydrOXYzine (ATARAX/VISTARIL) 10 MG tablet Take 1 tablet (10 mg total) by mouth 3 (three) times daily as needed for itching.   loratadine (CLARITIN) 10 MG tablet Take 1 tablet (10 mg total) by mouth daily.   memantine (NAMENDA) 10 MG tablet TAKE 1 TABLET BY MOUTH  DAILY   Multiple Vitamin (MULTIVITAMIN WITH MINERALS) TABS tablet Take 1 tablet by mouth daily.   sildenafil (VIAGRA) 50 MG tablet Take 1 tablet (50 mg total) by mouth daily as needed for erectile dysfunction.   simvastatin (ZOCOR) 20 MG tablet Take 1 tablet (20 mg total) by mouth at  bedtime.   zinc gluconate 50 MG tablet Take 50 mg by mouth 3 (three) times a week.   No facility-administered encounter medications on file as of 07/11/2021.    Allergies (verified) No known allergies   History: Past Medical History:  Diagnosis Date   Arthritis    Family history of heart disease    Forgetfulness    Hemorrhoids    History of nuclear stress test 1996   negative   Sleep apnea    Past Surgical History:  Procedure Laterality Date   CATARACT EXTRACTION, BILATERAL     HEMORRHOID SURGERY N/A 05/17/2013   Procedure: HEMORRHOIDECTOMY;  Surgeon: Fabio BeringBrent C Ziegler, MD;  Location: [facility];  Service: General;  Laterality: N/A;   INGUINAL HERNIA REPAIR Left 02/05/2017   Procedure: REPAIR LEFT INGUINAL HERNIA;  Surgeon: Violeta GelinasBurke Thompson, MD;  Location: [facility];  Service: General;  Laterality: Left;   INSERTION OF MESH Left 02/05/2017   Procedure: INSERTION OF MESH;  Surgeon: Violeta GelinasBurke Thompson, MD;  Location: [facility];  Service: General;  Laterality: Left;   KNEE ARTHROSCOPY Right    KNEE ARTHROSCOPY Left    Family History  Problem Relation Age of Onset   Heart disease Mother        MI @ 5579, stroke   Stroke Mother    Heart disease  Father        MI @ 59, CABG   Heart attack Brother    Heart attack Brother    Social History   Socioeconomic History   Marital status: Divorced    Spouse name: Not on file   Number of children: 2   Years of education: Not on file   Highest education level: Not on file  Occupational History   Occupation: retired principal     Associate Professor: RETIRED    Comment: [employer]  Tobacco Use   Smoking status: Former    Years: 6.00    Types: Cigarettes    Quit date: 10/20/1983    Years since quitting: 37.7   Smokeless tobacco: Never  Vaping Use   Vaping Use: Never used  Substance and Sexual Activity   Alcohol use: Yes    Alcohol/week: 1.0 standard drink    Types: 1 Glasses of wine per week    Comment: drinks 1/2 glass of wine with dinner    Drug use: No   Sexual activity: Never  Other Topics Concern   Not on file  Social History Narrative   Lives in [place], [place].   Live alone.    Divorced. 2 children, Live in [place].   Not dating anyone.   Eats all food groups.   Wears seatbelt.    Social Determinants of Health   Financial Resource Strain: Not on file  Food Insecurity: Not on file  Transportation Needs: Not on file  Physical Activity: Not on file  Stress: Not on file  Social Connections: Not on file    Tobacco Counseling Counseling given: Not Answered   Clinical Intake:                 Diabetic?no         Activities of Daily Living In your present state of health, do you have any difficulty performing the following activities: 03/13/2021  Hearing? N  Vision? N  Difficulty concentrating or making decisions? N  Walking or climbing stairs? N  Dressing or bathing? N  Doing errands, shopping? N  Some recent data might be hidden    Patient Care Team: Anabel Halon, MD as PCP - General (Internal Medicine)  Indicate any recent Medical Services you may have received from other than [facility] providers in the past year (date may be approximate).     Assessment:   This is a routine wellness examination for [place].  Hearing/Vision screen No results found.  Dietary issues and exercise activities discussed:     Goals Addressed   None   Depression Screen PHQ 2/9 Scores 04/12/2021 03/13/2021 11/19/2019 05/01/2018 03/31/2018 03/31/2018 10/23/2017  PHQ - 2 Score 0 0 0 0 3 3 0  PHQ- 9 Score - - - 1 12 12  -    Fall Risk Fall Risk  04/12/2021 03/13/2021 11/19/2019 06/29/2018 05/01/2018  Falls in the past year? 0 0 0 No No  Number falls in past yr: 0 0 - - -  Injury with Fall? 0 0 - - -  Risk for fall due to : No Fall Risks No Fall Risks - - -  Follow up Falls evaluation completed Falls evaluation completed Falls evaluation completed - -    FALL RISK PREVENTION PERTAINING TO THE HOME:  Any stairs in or  around the home? Yes  If so, are there any without handrails? Yes  Home free of loose throw rugs in walkways, pet beds, electrical cords, etc? Yes  Adequate  lighting in your home to reduce risk of falls? Yes   ASSISTIVE DEVICES UTILIZED TO PREVENT FALLS:  Life alert? No  Use of a cane, walker or w/c? No  Grab bars in the bathroom? Yes  Shower chair or bench in shower? No  Elevated toilet seat or a handicapped toilet? Yes   TIMED UP AND GO:  Was the test performed? No .  Length of time to ambulate 10 feet: NA sec.   Function: MMSE - Mini Mental State Exam 11/19/2018 03/31/2018  Orientation to time 4 5  Orientation to Place 5 5  Registration 3 3  Attention/ Calculation 5 2  Recall 2 0  Language- name 2 objects 2 2  Language- repeat 1 1  Language- follow 3 step command 3 3  Language- read & follow direction 1 1  Write a sentence 1 1  Copy design 0 0  Total score 27 23        Immunizations Immunization History  Administered Date(s) Administered   Fluad Quad(high Dose 65+) 08/16/2020   Influenza, High Dose Seasonal PF 09/30/2017, 10/13/2018, 08/19/2019   Influenza-Unspecified 09/30/2017   Moderna SARS-COV2 Booster Vaccination 10/13/2020   Moderna Sars-Covid-2 Vaccination 01/22/2020, 02/22/2020   Pneumococcal Conjugate-13 09/30/2017   Pneumococcal Polysaccharide-23 04/06/2019   Tdap 10/23/2017    TDAP status: Up to date  Flu Vaccine status: Up to date  Pneumococcal vaccine status: Up to date  Covid-19 vaccine status: Completed vaccines  Qualifies for Shingles Vaccine? Yes   Zostavax completed No   Shingrix Completed?: No.    Education has been provided regarding the importance of this vaccine. Patient has been advised to call insurance company to determine out of pocket expense if they have not yet received this vaccine. Advised may also receive vaccine at local pharmacy or Health Dept. Verbalized acceptance and understanding.  Screening Tests Health Maintenance   Topic Date Due   Zoster Vaccines- Shingrix (1 of 2) Never done   COVID-19 Vaccine (4 - Booster for Moderna series) 02/12/2021   INFLUENZA VACCINE  07/16/2021   TETANUS/TDAP  10/24/2027   PNA vac Low Risk Adult  Completed   HPV VACCINES  Aged Out    Health Maintenance  Health Maintenance Due  Topic Date Due   Zoster Vaccines- Shingrix (1 of 2) Never done   COVID-19 Vaccine (4 - Booster for Moderna series) 02/12/2021    Colorectal cancer screening: No longer required.   Lung Cancer Screening: (Low Dose CT Chest recommended if Age 53-80 years, 30 pack-year currently smoking OR have quit w/in 15years.) does not qualify.   Lung Cancer Screening Referral: NA  Additional Screening:  Hepatitis C Screening: does qualify; Completed   Vision Screening: Recommended annual ophthalmology exams for early detection of glaucoma and other disorders of the eye. Is the patient up to date with their annual eye exam?  No  Who is the provider or what is the name of the office in which the patient attends annual eye exams? [facility]  If pt is not established with a provider, would they like to be referred to a provider to establish care? No .   Dental Screening: Recommended annual dental exams for proper oral hygiene  Community Resource Referral / Chronic Care Management: CRR required this visit?  No   CCM required this visit?  No      Plan:     I have personally reviewed and noted the following in the patient's chart:   Medical and social  history Use of alcohol, tobacco or illicit drugs  Current medications and supplements including opioid prescriptions. Patient is not currently taking opioid prescriptions. Functional ability and status Nutritional status Physical activity Advanced directives List of other physicians Hospitalizations, surgeries, and ER visits in previous 12 months Vitals Screenings to include cognitive, depression, and falls Referrals and  appointments  In addition, I have reviewed and discussed with patient certain preventive protocols, quality metrics, and best practice recommendations. A written personalized care plan for preventive services as well as general preventive health recommendations were provided to patient.     Park Breed, CMA   07/11/2021   Nurse Notes: Telehealth, Spoke with patient about 40 mins.

## 2021-07-24 ENCOUNTER — Telehealth: Payer: Self-pay

## 2021-07-24 ENCOUNTER — Other Ambulatory Visit: Payer: Self-pay | Admitting: *Deleted

## 2021-07-24 MED ORDER — AMLODIPINE BESYLATE 5 MG PO TABS: 5.0000 mg | ORAL_TABLET | Freq: Every day | ORAL | 3 refills | Status: DC

## 2021-07-24 NOTE — Telephone Encounter (Signed)
Pt medication sent to pharmacy  

## 2021-07-24 NOTE — Telephone Encounter (Signed)
Patient need refill amLODipine (NORVASC) 5 MG tablet to [facility] on [address]

## 2021-07-30 NOTE — Progress Notes (Signed)
I connected with  Chris Taylor on 07/30/21 by an audio enabled telemedicine application and verified that I am speaking with the correct person using two identifiers.   I discussed the limitations, risks, security and privacy concerns of performing an evaluation and management service by telephone and the availability of in person appointments. I also discussed with the patient that there may be a patient responsible charge related to this service. The patient expressed understanding and verbally consented to this telephonic visit.  The patient was at home. The provider was Geralynn Rile NP who was in office. This was a telehealth visit.

## 2021-07-31 ENCOUNTER — Other Ambulatory Visit: Payer: Self-pay | Admitting: *Deleted

## 2021-07-31 MED ORDER — MEMANTINE HCL 10 MG PO TABS: ORAL_TABLET | ORAL | 1 refills | Status: DC

## 2021-07-31 MED ORDER — AMLODIPINE BESYLATE 5 MG PO TABS: 5.0000 mg | ORAL_TABLET | Freq: Every day | ORAL | 1 refills | Status: DC

## 2021-08-06 DIAGNOSIS — G4733 Obstructive sleep apnea (adult) (pediatric): Secondary | ICD-10-CM | POA: Diagnosis not present

## 2021-10-16 ENCOUNTER — Ambulatory Visit (INDEPENDENT_AMBULATORY_CARE_PROVIDER_SITE_OTHER): Payer: Medicare Other | Admitting: Internal Medicine

## 2021-10-16 ENCOUNTER — Other Ambulatory Visit: Payer: Self-pay

## 2021-10-16 ENCOUNTER — Encounter: Payer: Self-pay | Admitting: Internal Medicine

## 2021-10-16 VITALS — BP 134/88 | HR 77 | Temp 98.4°F | Resp 18 | Ht 69.0 in | Wt 177.0 lb

## 2021-10-16 DIAGNOSIS — G4733 Obstructive sleep apnea (adult) (pediatric): Secondary | ICD-10-CM | POA: Diagnosis not present

## 2021-10-16 DIAGNOSIS — I10 Essential (primary) hypertension: Secondary | ICD-10-CM

## 2021-10-16 DIAGNOSIS — H538 Other visual disturbances: Secondary | ICD-10-CM | POA: Diagnosis not present

## 2021-10-16 DIAGNOSIS — R413 Other amnesia: Secondary | ICD-10-CM

## 2021-10-16 NOTE — Progress Notes (Signed)
Established Patient Office Visit  Subjective:  Patient ID: Chris Taylor, male    DOB: July 10, 1941  Age: 80 y.o. MRN: [id number]  CC:  Chief Complaint  Patient presents with   Follow-up    6 month follow up pt wants to discuss RSV virus not having any symptoms would like to discuss    HPI Chris Taylor is a 80 year old male with PMH of HTN, OSA on CPAP, HLD and mild cognitive impairment who follow up of his chronic medical conditions.  BP is well-controlled. Takes medications regularly. Patient denies headache, dizziness, chest pain, dyspnea or palpitations.  She heard about RSV infection on TV and asked about the symptoms of it.  I had discussion about its symptoms and common preventive measures.  He takes Namenda for mild cognitive impairment.  He is independent for ADLs currently.  Denies any agitation or anhedonia currently.  He is still getting evaluated for dental appliance for OSA as he is having difficulty with CPAP device.  He complains of blurry vision, which is chronic.  He has had a laser surgery in the past with [facility] and wants a referral to ophthalmology.  Denies any eye pain or discharge currently.    Past Medical History:  Diagnosis Date   Arthritis    Family history of heart disease    Forgetfulness    Hemorrhoids    History of nuclear stress test 1996   negative   Sleep apnea     Past Surgical History:  Procedure Laterality Date   CATARACT EXTRACTION, BILATERAL     HEMORRHOID SURGERY N/A 05/17/2013   Procedure: HEMORRHOIDECTOMY;  Surgeon: Fabio Bering, MD;  Location: [facility];  Service: General;  Laterality: N/A;   INGUINAL HERNIA REPAIR Left 02/05/2017   Procedure: REPAIR LEFT INGUINAL HERNIA;  Surgeon: Violeta Gelinas, MD;  Location: [facility] OR;  Service: General;  Laterality: Left;   INSERTION OF MESH Left 02/05/2017   Procedure: INSERTION OF MESH;  Surgeon: Violeta Gelinas, MD;  Location: [facility] OR;  Service: General;  Laterality: Left;    KNEE ARTHROSCOPY Right    KNEE ARTHROSCOPY Left     Family History  Problem Relation Age of Onset   Heart disease Mother        MI @ 76, stroke   Stroke Mother    Heart disease Father        MI @ 58, CABG   Heart attack Brother    Heart attack Brother     Social History   Socioeconomic History   Marital status: Divorced    Spouse name: Not on file   Number of children: 2   Years of education: Not on file   Highest education level: Not on file  Occupational History   Occupation: retired principal     Associate Professor: RETIRED    Comment: [employer]  Tobacco Use   Smoking status: Former    Years: 6.00    Types: Cigarettes    Quit date: 10/20/1983    Years since quitting: 38.0   Smokeless tobacco: Never  Vaping Use   Vaping Use: Never used  Substance and Sexual Activity   Alcohol use: Yes    Alcohol/week: 1.0 standard drink    Types: 1 Glasses of wine per week    Comment: drinks 1/2 glass of wine with dinner   Drug use: No   Sexual activity: Never  Other Topics Concern   Not on file  Social History Narrative  Lives in [place], [place].   Live alone.    Divorced. 2 children, Live in [place].   Not dating anyone.   Eats all food groups.   Wears seatbelt.    Social Determinants of Health   Financial Resource Strain: Low Risk    Difficulty of Paying Living Expenses: Not hard at all  Food Insecurity: No Food Insecurity   Worried About Charity fundraiser in the Last Year: Never true   [place] in the Last Year: Never true  Transportation Needs: No Transportation Needs   Lack of Transportation (Medical): No   Lack of Transportation (Non-Medical): No  Physical Activity: Sufficiently Active   Days of Exercise per Week: 7 days   Minutes of Exercise per Session: 30 min  Stress: No Stress Concern Present   Feeling of Stress : Not at all  Social Connections: Moderately Integrated   Frequency of Communication with Friends and Family: More than three times a  week   Frequency of Social Gatherings with Friends and Family: More than three times a week   Attends Religious Services: More than 4 times per year   Active Member of [employer] or Organizations: Yes   Attends Archivist Meetings: 1 to 4 times per year   Marital Status: Divorced  Human resources officer Violence: Not At Risk   Fear of Current or Ex-Partner: No   Emotionally Abused: No   Physically Abused: No   Sexually Abused: No    Outpatient Medications Prior to Visit  Medication Sig Dispense Refill   albuterol (VENTOLIN HFA) 108 (90 Base) MCG/ACT inhaler Inhale 2 puffs into the lungs every 6 (six) hours as needed for wheezing or shortness of breath. 8 g 0   amLODipine (NORVASC) 5 MG tablet Take 1 tablet (5 mg total) by mouth daily. 90 tablet 1   aspirin EC 81 MG tablet Take 81 mg by mouth daily.     Cod Liver Oil OIL Take 15 mLs by mouth daily.     Coenzyme Q10 (COQ10) 200 MG CAPS Take 200 mg by mouth 3 (three) times a week.     fluticasone (FLONASE) 50 MCG/ACT nasal spray Place 2 sprays into both nostrils daily. 16 g 6   Garlic 6644 MG CAPS Take 1,000 mg by mouth 3 (three) times a week.     Ginkgo 60 MG TABS Take 60 mg by mouth 3 (three) times a week.      hydrOXYzine (ATARAX/VISTARIL) 10 MG tablet Take 1 tablet (10 mg total) by mouth 3 (three) times daily as needed for itching. 30 tablet 0   loratadine (CLARITIN) 10 MG tablet Take 1 tablet (10 mg total) by mouth daily. 90 tablet 3   memantine (NAMENDA) 10 MG tablet TAKE 1 TABLET BY MOUTH  DAILY 90 tablet 1   Multiple Vitamin (MULTIVITAMIN WITH MINERALS) TABS tablet Take 1 tablet by mouth daily.     sildenafil (VIAGRA) 50 MG tablet Take 1 tablet (50 mg total) by mouth daily as needed for erectile dysfunction. 60 tablet 0   simvastatin (ZOCOR) 20 MG tablet Take 1 tablet (20 mg total) by mouth at bedtime. 90 tablet 3   zinc gluconate 50 MG tablet Take 50 mg by mouth 3 (three) times a week.     No facility-administered medications  prior to visit.    Allergies  Allergen Reactions   No Known Allergies     ROS Review of Systems  Constitutional:  Negative for chills and fever.  HENT:  Negative for congestion and sore throat.   Eyes:  Negative for pain and discharge.  Respiratory:  Negative for cough and shortness of breath.   Cardiovascular:  Negative for chest pain and palpitations.  Gastrointestinal:  Negative for constipation, diarrhea, nausea and vomiting.  Endocrine: Negative for polydipsia and polyuria.  Genitourinary:  Negative for dysuria and hematuria.  Musculoskeletal:  Positive for arthralgias (knee). Negative for neck pain and neck stiffness.  Skin:  Negative for rash.  Neurological:  Negative for dizziness, weakness, numbness and headaches.  Psychiatric/Behavioral:  Negative for agitation and behavioral problems.      Objective:    Physical Exam Vitals reviewed.  Constitutional:      General: He is not in acute distress.    Appearance: He is not diaphoretic.  HENT:     Head: Normocephalic and atraumatic.     Nose: Nose normal.     Mouth/Throat:     Mouth: Mucous membranes are moist.  Eyes:     General: No scleral icterus.    Extraocular Movements: Extraocular movements intact.  Neck:     Vascular: No carotid bruit.  Cardiovascular:     Rate and Rhythm: Normal rate and regular rhythm.     Pulses: Normal pulses.     Heart sounds: Normal heart sounds. No murmur heard. Pulmonary:     Breath sounds: Normal breath sounds. No wheezing or rales.  Musculoskeletal:     Cervical back: Neck supple. No tenderness.     Right lower leg: No edema.     Left lower leg: No edema.  Skin:    General: Skin is warm.     Findings: No rash.  Neurological:     General: No focal deficit present.     Mental Status: He is alert and oriented to person, place, and time.     Sensory: No sensory deficit.     Motor: No weakness.  Psychiatric:        Mood and Affect: Mood normal.        Behavior: Behavior  normal.    BP 134/88 (BP Location: Left Arm, Cuff Size: Normal)   Pulse 77   Temp 98.4 F (36.9 C) (Oral)   Resp 18   Ht $R'5\' 9"'cG$  (1.753 m)   Wt 177 lb 0.6 oz (80.3 kg)   SpO2 98%   BMI 26.14 kg/m  Wt Readings from Last 3 Encounters:  10/16/21 177 lb 0.6 oz (80.3 kg)  04/12/21 179 lb (81.2 kg)  03/13/21 181 lb 1.9 oz (82.2 kg)     Health Maintenance Due  Topic Date Due   Zoster Vaccines- Shingrix (1 of 2) Never done   COVID-19 Vaccine (3 - Booster for Moderna series) 12/08/2020   INFLUENZA VACCINE  07/16/2021    There are no preventive care reminders to display for this patient.  Lab Results  Component Value Date   TSH 3.010 04/12/2021   Lab Results  Component Value Date   WBC 4.3 04/12/2021   HGB 15.6 04/12/2021   HCT 45.9 04/12/2021   MCV 95 04/12/2021   PLT 181 04/12/2021   Lab Results  Component Value Date   NA 142 04/12/2021   K 4.4 04/12/2021   CO2 22 04/12/2021   GLUCOSE 98 04/12/2021   BUN 11 04/12/2021   CREATININE 1.06 04/12/2021   BILITOT 0.6 04/12/2021   ALKPHOS 69 04/12/2021   AST 40 04/12/2021   ALT 32 04/12/2021   PROT 7.3 04/12/2021   ALBUMIN  4.7 04/12/2021   CALCIUM 9.2 04/12/2021   EGFR 71 04/12/2021   Lab Results  Component Value Date   CHOL 185 04/12/2021   Lab Results  Component Value Date   HDL 88 04/12/2021   Lab Results  Component Value Date   LDLCALC 86 04/12/2021   Lab Results  Component Value Date   TRIG 56 04/12/2021   Lab Results  Component Value Date   CHOLHDL 2.1 04/12/2021   Lab Results  Component Value Date   HGBA1C 5.5 04/12/2021      Assessment & Plan:   Problem List Items Addressed This Visit       Cardiovascular and Mediastinum   Essential hypertension, benign - Primary    BP Readings from Last 1 Encounters:  10/16/21 134/88  Well-controlled with Amlodipine 5 mg QD - considering his age 72 for compliance with the medications Advised DASH diet and moderate exercise/walking, at least  150 mins/week        Respiratory   OSA (obstructive sleep apnea)    Uses CPAP device at nighttime - though has difficulty sleeping with it Undergoing dental appliance evaluation        Other   Memory impairment    On [facility] for ADLs and IADLs.      Other Visit Diagnoses     Blurry vision       Relevant Orders   Ambulatory referral to Ophthalmology       No orders of the defined types were placed in this encounter.   Follow-up: Return in about 6 months (around 04/15/2022) for Annual physical.    Lindell Spar, MD

## 2021-10-16 NOTE — Assessment & Plan Note (Signed)
Uses CPAP device at nighttime - though has difficulty sleeping with it Undergoing dental appliance evaluation

## 2021-10-16 NOTE — Patient Instructions (Addendum)
Please continue to take medications as prescribed. ? ?Please continue to follow low salt diet and ambulate as tolerated. ?

## 2021-10-16 NOTE — Assessment & Plan Note (Addendum)
On Namenda Independent for ADLs and IADLs. 

## 2021-10-16 NOTE — Assessment & Plan Note (Signed)
BP Readings from Last 1 Encounters:  10/16/21 134/88   Well-controlled with Amlodipine 5 mg QD - considering his age Counseled for compliance with the medications Advised DASH diet and moderate exercise/walking, at least 150 mins/week

## 2021-11-07 DIAGNOSIS — G4733 Obstructive sleep apnea (adult) (pediatric): Secondary | ICD-10-CM | POA: Diagnosis not present

## 2021-11-22 DIAGNOSIS — H26491 Other secondary cataract, right eye: Secondary | ICD-10-CM | POA: Diagnosis not present

## 2021-12-18 ENCOUNTER — Other Ambulatory Visit: Payer: Self-pay | Admitting: Internal Medicine

## 2022-03-14 DIAGNOSIS — G4733 Obstructive sleep apnea (adult) (pediatric): Secondary | ICD-10-CM | POA: Diagnosis not present

## 2022-03-18 ENCOUNTER — Other Ambulatory Visit: Payer: Self-pay | Admitting: Internal Medicine

## 2022-04-16 ENCOUNTER — Encounter: Payer: Self-pay | Admitting: Internal Medicine

## 2022-04-16 ENCOUNTER — Ambulatory Visit (INDEPENDENT_AMBULATORY_CARE_PROVIDER_SITE_OTHER): Payer: Medicare Other | Admitting: Internal Medicine

## 2022-04-16 VITALS — BP 128/78 | HR 58 | Resp 18 | Ht 69.0 in | Wt 175.0 lb

## 2022-04-16 DIAGNOSIS — E559 Vitamin D deficiency, unspecified: Secondary | ICD-10-CM

## 2022-04-16 DIAGNOSIS — F02A0 Dementia in other diseases classified elsewhere, mild, without behavioral disturbance, psychotic disturbance, mood disturbance, and anxiety: Secondary | ICD-10-CM

## 2022-04-16 DIAGNOSIS — I10 Essential (primary) hypertension: Secondary | ICD-10-CM | POA: Diagnosis not present

## 2022-04-16 DIAGNOSIS — M170 Bilateral primary osteoarthritis of knee: Secondary | ICD-10-CM

## 2022-04-16 DIAGNOSIS — Z0001 Encounter for general adult medical examination with abnormal findings: Secondary | ICD-10-CM | POA: Insufficient documentation

## 2022-04-16 DIAGNOSIS — E782 Mixed hyperlipidemia: Secondary | ICD-10-CM | POA: Diagnosis not present

## 2022-04-16 DIAGNOSIS — G309 Alzheimer's disease, unspecified: Secondary | ICD-10-CM

## 2022-04-16 DIAGNOSIS — G4733 Obstructive sleep apnea (adult) (pediatric): Secondary | ICD-10-CM | POA: Diagnosis not present

## 2022-04-16 NOTE — Assessment & Plan Note (Signed)
Uses CPAP device at nighttime Had dental appliance, but did not tolerate it 

## 2022-04-16 NOTE — Progress Notes (Signed)
? ?Established Patient Office Visit ? ?Subjective:  ?Patient ID: Chris Taylor, male    DOB: 07-Dec-1941  Age: 81 y.o. MRN: [id number] ? ?CC:  ?Chief Complaint  ?Patient presents with  ? Annual Exam  ?  Annual exam pt having pain in knees but this has been going on for 10 years  ? ? ?HPI ?Chris Taylor is a 81 y.o. male with past medical history of HTN, OSA on CPAP, HLD and mild cognitive impairment who presents for annual physical. ? ?BP is well-controlled. Takes medications regularly. Patient denies headache, dizziness, chest pain, dyspnea or palpitations. ? ?He takes Namenda for mild cognitive impairment.  He is independent for ADLs currently.  Denies any agitation or anhedonia currently. ? ?He c/o b/l knee pain, which is chronic.  Denies any recent injury or fall.  Has not tried Tylenol or any NSAID yet. He also has b/l hand joint swelling, which is chronic. ? ?Past Medical History:  ?Diagnosis Date  ? Arthritis   ? Family history of heart disease   ? Forgetfulness   ? Hemorrhoids   ? History of nuclear stress test 1996  ? negative  ? Sleep apnea   ? ? ?Past Surgical History:  ?Procedure Laterality Date  ? CATARACT EXTRACTION, BILATERAL    ? HEMORRHOID SURGERY N/A 05/17/2013  ? Procedure: HEMORRHOIDECTOMY;  Surgeon: Fabio Bering, MD;  Location: [facility];  Service: General;  Laterality: N/A;  ? INGUINAL HERNIA REPAIR Left 02/05/2017  ? Procedure: REPAIR LEFT INGUINAL HERNIA;  Surgeon: Violeta Gelinas, MD;  Location: [facility] OR;  Service: General;  Laterality: Left;  ? INSERTION OF MESH Left 02/05/2017  ? Procedure: INSERTION OF MESH;  Surgeon: Violeta Gelinas, MD;  Location: [facility] OR;  Service: General;  Laterality: Left;  ? KNEE ARTHROSCOPY Right   ? KNEE ARTHROSCOPY Left   ? ? ?Family History  ?Problem Relation Age of Onset  ? Heart disease Mother   ?     MI @ 33, stroke  ? Stroke Mother   ? Heart disease Father   ?     MI @ 50, CABG  ? Heart attack Brother   ? Heart attack Brother   ? ? ?Social History   ? ?Socioeconomic History  ? Marital status: Divorced  ?  Spouse name: Not on file  ? Number of children: 2  ? Years of education: Not on file  ? Highest education level: Not on file  ?Occupational History  ? Occupation: retired principal   ?  Associate Professor: RETIRED  ?  Comment: [employer]  ?Tobacco Use  ? Smoking status: Former  ?  Years: 6.00  ?  Types: Cigarettes  ?  Quit date: 10/20/1983  ?  Years since quitting: 38.5  ? Smokeless tobacco: Never  ?Vaping Use  ? Vaping Use: Never used  ?Substance and Sexual Activity  ? Alcohol use: Yes  ?  Alcohol/week: 1.0 standard drink  ?  Types: 1 Glasses of wine per week  ?  Comment: drinks 1/2 glass of wine with dinner  ? Drug use: No  ? Sexual activity: Never  ?Other Topics Concern  ? Not on file  ?Social History Narrative  ? Lives in [place], [place].  ? Live alone.   ? Divorced. 2 children, Live in [place].  ? Not dating anyone.  ? Eats all food groups.  ? Wears seatbelt.   ? ?Social Determinants of Health  ? ?Financial Resource Strain: Low Risk   ? Difficulty  of Paying Living Expenses: Not hard at all  ?Food Insecurity: No Food Insecurity  ? Worried About Programme researcher, broadcasting/film/video in the Last Year: Never true  ? Ran Out of Food in the Last Year: Never true  ?Transportation Needs: No Transportation Needs  ? Lack of Transportation (Medical): No  ? Lack of Transportation (Non-Medical): No  ?Physical Activity: Sufficiently Active  ? Days of Exercise per Week: 7 days  ? Minutes of Exercise per Session: 30 min  ?Stress: No Stress Concern Present  ? Feeling of Stress : Not at all  ?Social Connections: Moderately Integrated  ? Frequency of Communication with Friends and Family: More than three times a week  ? Frequency of Social Gatherings with Friends and Family: More than three times a week  ? Attends Religious Services: More than 4 times per year  ? Active Member of Clubs or Organizations: Yes  ? Attends Banker Meetings: 1 to 4 times per year  ? Marital Status:  Divorced  ?Intimate Partner Violence: Not At Risk  ? Fear of Current or Ex-Partner: No  ? Emotionally Abused: No  ? Physically Abused: No  ? Sexually Abused: No  ? ? ?Outpatient Medications Prior to Visit  ?Medication Sig Dispense Refill  ? albuterol (VENTOLIN HFA) 108 (90 Base) MCG/ACT inhaler Inhale 2 puffs into the lungs every 6 (six) hours as needed for wheezing or shortness of breath. 8 g 0  ? amLODipine (NORVASC) 5 MG tablet TAKE 1 TABLET BY MOUTH  DAILY 90 tablet 3  ? aspirin EC 81 MG tablet Take 81 mg by mouth daily.    ? Cod Liver Oil OIL Take 15 mLs by mouth daily.    ? Coenzyme Q10 (COQ10) 200 MG CAPS Take 200 mg by mouth 3 (three) times a week.    ? fluticasone (FLONASE) 50 MCG/ACT nasal spray Place 2 sprays into both nostrils daily. 16 g 6  ? Garlic 1000 MG CAPS Take 1,000 mg by mouth 3 (three) times a week.    ? Ginkgo 60 MG TABS Take 60 mg by mouth 3 (three) times a week.     ? hydrOXYzine (ATARAX/VISTARIL) 10 MG tablet Take 1 tablet (10 mg total) by mouth 3 (three) times daily as needed for itching. 30 tablet 0  ? loratadine (CLARITIN) 10 MG tablet Take 1 tablet (10 mg total) by mouth daily. 90 tablet 3  ? memantine (NAMENDA) 10 MG tablet TAKE 1 TABLET BY MOUTH  DAILY 90 tablet 3  ? Multiple Vitamin (MULTIVITAMIN WITH MINERALS) TABS tablet Take 1 tablet by mouth daily.    ? sildenafil (VIAGRA) 50 MG tablet Take 1 tablet (50 mg total) by mouth daily as needed for erectile dysfunction. 60 tablet 0  ? simvastatin (ZOCOR) 20 MG tablet Take 1 tablet (20 mg total) by mouth at bedtime. 90 tablet 3  ? zinc gluconate 50 MG tablet Take 50 mg by mouth 3 (three) times a week.    ? ?No facility-administered medications prior to visit.  ? ? ?Allergies  ?Allergen Reactions  ? No Known Allergies   ? ? ?ROS ?Review of Systems  ?Constitutional:  Negative for chills and fever.  ?HENT:  Negative for congestion and sore throat.   ?Eyes:  Negative for pain and discharge.  ?Respiratory:  Negative for cough and shortness of  breath.   ?Cardiovascular:  Negative for chest pain and palpitations.  ?Gastrointestinal:  Negative for constipation, diarrhea, nausea and vomiting.  ?Endocrine: Negative for polydipsia  and polyuria.  ?Genitourinary:  Negative for dysuria and hematuria.  ?Musculoskeletal:  Positive for arthralgias (knee) and neck pain. Negative for neck stiffness.  ?Skin:  Negative for rash.  ?Neurological:  Negative for dizziness, weakness, numbness and headaches.  ?Psychiatric/Behavioral:  Negative for agitation and behavioral problems.   ? ?  ?Objective:  ?  ?Physical Exam ?Vitals reviewed.  ?Constitutional:   ?   General: He is not in acute distress. ?   Appearance: He is not diaphoretic.  ?HENT:  ?   Head: Normocephalic and atraumatic.  ?   Nose: Nose normal.  ?   Mouth/Throat:  ?   Mouth: Mucous membranes are moist.  ?Eyes:  ?   General: No scleral icterus. ?   Extraocular Movements: Extraocular movements intact.  ?Neck:  ?   Vascular: No carotid bruit.  ?Cardiovascular:  ?   Rate and Rhythm: Normal rate and regular rhythm.  ?   Pulses: Normal pulses.  ?   Heart sounds: Normal heart sounds. No murmur heard. ?Pulmonary:  ?   Breath sounds: Normal breath sounds. No wheezing or rales.  ?Abdominal:  ?   Palpations: Abdomen is soft.  ?   Tenderness: There is no abdominal tenderness.  ?Musculoskeletal:     ?   General: Swelling (B/l knee, PIP and DIP joints of b/l hands) present.  ?   Cervical back: Neck supple. No tenderness.  ?   Right lower leg: No edema.  ?   Left lower leg: No edema.  ?   Comments: Heberden and Bouchard's nodes seen on b/l hands  ?Skin: ?   General: Skin is warm.  ?   Findings: No rash.  ?Neurological:  ?   General: No focal deficit present.  ?   Mental Status: He is alert and oriented to person, place, and time.  ?   Cranial Nerves: No cranial nerve deficit.  ?   Sensory: No sensory deficit.  ?   Motor: No weakness.  ?Psychiatric:     ?   Mood and Affect: Mood normal.     ?   Behavior: Behavior normal.   ? ? ?BP 128/78 (BP Location: Right Arm, Patient Position: Sitting, Cuff Size: Normal)   Pulse (!) 58   Resp 18   Ht  (1.753 m)   Wt 175 lb (79.4 kg)   SpO2 98%   BMI 25.84 kg/m?  ?Wt Readings from Last 3 Encounters:

## 2022-04-16 NOTE — Assessment & Plan Note (Signed)
BP Readings from Last 1 Encounters:  ?04/16/22 128/78  ? ?Well-controlled with Amlodipine 5 mg QD - considering his age ?Counseled for compliance with the medications ?Advised DASH diet and moderate exercise/walking, at least 150 mins/week ?

## 2022-04-16 NOTE — Assessment & Plan Note (Signed)
On Namenda Independent for ADLs and IADLs. 

## 2022-04-16 NOTE — Progress Notes (Signed)
Annuale  ?

## 2022-04-16 NOTE — Assessment & Plan Note (Signed)
Advised to take Tylenol PRN Voltaren gel PRN 

## 2022-04-16 NOTE — Assessment & Plan Note (Signed)

## 2022-04-16 NOTE — Patient Instructions (Signed)
Please continue taking medications as prescribed. ? ?Okay to take Tylenol up to twice daily for knee pain. Okay to apply Voltaren gel for knee pain and hand pain. ? ?Please consider getting Shingrix vaccine at your local pharmacy. ?

## 2022-04-17 LAB — LIPID PANEL
Chol/HDL Ratio: 2.1 ratio (ref 0.0–5.0)
Cholesterol, Total: 180 mg/dL (ref 100–199)
HDL: 85 mg/dL (ref >39)
LDL Chol Calc (NIH): 84 mg/dL (ref 0–99)
Triglycerides: 55 mg/dL (ref 0–149)
VLDL Cholesterol Cal: 11 mg/dL (ref 5–40)

## 2022-04-17 LAB — HEMOGLOBIN A1C
Est. average glucose Bld gHb Est-mCnc: 114 mg/dL
Hgb A1c MFr Bld: 5.6 % (ref 4.8–5.6)

## 2022-04-17 LAB — CBC WITH DIFFERENTIAL/PLATELET
Basophils Absolute: 0 10*3/uL (ref 0.0–0.2)
Basos: 1 %
EOS (ABSOLUTE): 0.3 10*3/uL (ref 0.0–0.4)
Eos: 8 %
Hematocrit: 46.2 % (ref 37.5–51.0)
Hemoglobin: 15.1 g/dL (ref 13.0–17.7)
Immature Grans (Abs): 0 10*3/uL (ref 0.0–0.1)
Immature Granulocytes: 0 %
Lymphocytes Absolute: 1.7 10*3/uL (ref 0.7–3.1)
Lymphs: 42 %
MCH: 31.5 pg (ref 26.6–33.0)
MCHC: 32.7 g/dL (ref 31.5–35.7)
MCV: 97 fL (ref 79–97)
Monocytes Absolute: 0.4 10*3/uL (ref 0.1–0.9)
Monocytes: 9 %
Neutrophils Absolute: 1.7 10*3/uL (ref 1.4–7.0)
Neutrophils: 40 %
Platelets: 194 10*3/uL (ref 150–450)
RBC: 4.79 x10E6/uL (ref 4.14–5.80)
RDW: 12.4 % (ref 11.6–15.4)
WBC: 4.2 10*3/uL (ref 3.4–10.8)

## 2022-04-17 LAB — URINALYSIS
Bilirubin, UA: NEGATIVE
Glucose, UA: NEGATIVE
Ketones, UA: NEGATIVE
Leukocytes,UA: NEGATIVE
Nitrite, UA: NEGATIVE
Protein,UA: NEGATIVE
RBC, UA: NEGATIVE
Specific Gravity, UA: 1.025 (ref 1.005–1.030)
Urobilinogen, Ur: 0.2 mg/dL (ref 0.2–1.0)
pH, UA: 5 (ref 5.0–7.5)

## 2022-04-17 LAB — VITAMIN D 25 HYDROXY (VIT D DEFICIENCY, FRACTURES): Vit D, 25-Hydroxy: 72.7 ng/mL (ref 30.0–100.0)

## 2022-04-17 LAB — CMP14+EGFR
ALT: 29 IU/L (ref 0–44)
AST: 37 IU/L (ref 0–40)
Albumin/Globulin Ratio: 1.8 (ref 1.2–2.2)
Albumin: 4.6 g/dL (ref 3.6–4.6)
Alkaline Phosphatase: 66 IU/L (ref 44–121)
BUN/Creatinine Ratio: 10 (ref 10–24)
BUN: 10 mg/dL (ref 8–27)
Bilirubin Total: 0.5 mg/dL (ref 0.0–1.2)
CO2: 23 mmol/L (ref 20–29)
Calcium: 9.3 mg/dL (ref 8.6–10.2)
Chloride: 104 mmol/L (ref 96–106)
Creatinine, Ser: 1.02 mg/dL (ref 0.76–1.27)
Globulin, Total: 2.6 g/dL (ref 1.5–4.5)
Glucose: 88 mg/dL (ref 70–99)
Potassium: 4.1 mmol/L (ref 3.5–5.2)
Sodium: 141 mmol/L (ref 134–144)
Total Protein: 7.2 g/dL (ref 6.0–8.5)
eGFR: 74 mL/min/{1.73_m2} (ref >59)

## 2022-04-17 LAB — TSH: TSH: 2.93 u[IU]/mL (ref 0.450–4.500)

## 2022-04-22 ENCOUNTER — Other Ambulatory Visit: Payer: Self-pay | Admitting: Internal Medicine

## 2022-04-22 DIAGNOSIS — E782 Mixed hyperlipidemia: Secondary | ICD-10-CM

## 2022-07-12 ENCOUNTER — Ambulatory Visit (INDEPENDENT_AMBULATORY_CARE_PROVIDER_SITE_OTHER): Payer: Medicare Other

## 2022-07-12 DIAGNOSIS — Z0000 Encounter for general adult medical examination without abnormal findings: Secondary | ICD-10-CM

## 2022-07-12 NOTE — Progress Notes (Signed)
Subjective:   Chris Taylor is a 81 y.o. male who presents for an Initial Medicare Annual Wellness Visit. I connected with  Chris Taylor on 07/12/22 by a audio enabled telemedicine application and verified that I am speaking with the correct person using two identifiers.  Patient Location: Home  Provider Location: Office/Clinic  I discussed the limitations of evaluation and management by telemedicine. The patient expressed understanding and agreed to proceed.   Review of Systems     [name] , Thank you for taking time to come for your Medicare Wellness Visit. I appreciate your ongoing commitment to your health goals. Please review the following plan we discussed and let me know if I can assist you in the future.   These are the goals we discussed:  Goals      Patient Stated     Would like to just keep going         This is a list of the screening recommended for you and due dates:  Health Maintenance  Topic Date Due   Zoster (Shingles) Vaccine (1 of 2) Never done   COVID-19 Vaccine (3 - Moderna series) 11/28/2021   Flu Shot  07/16/2022   Tetanus Vaccine  10/24/2027   Pneumonia Vaccine  Completed   HPV Vaccine  Aged Out          Objective:    There were no vitals filed for this visit. There is no height or weight on file to calculate BMI.     07/11/2021    1:50 PM 02/05/2017    3:33 PM 01/29/2017    8:29 AM 05/14/2013    8:20 AM  Advanced Directives  Does Patient Have a Medical Advance Directive? No No No Patient does not have advance directive;Patient would not like information  Would patient like information on creating a medical advance directive? Yes (MAU/Ambulatory/Procedural Areas - Information given) No - Patient declined No - Patient declined   Pre-existing out of facility DNR order (yellow form or pink MOST form)    No    Current Medications (verified) Outpatient Encounter Medications as of 07/12/2022  Medication Sig   albuterol  (VENTOLIN HFA) 108 (90 Base) MCG/ACT inhaler Inhale 2 puffs into the lungs every 6 (six) hours as needed for wheezing or shortness of breath.   amLODipine (NORVASC) 5 MG tablet TAKE 1 TABLET BY MOUTH  DAILY   aspirin EC 81 MG tablet Take 81 mg by mouth daily.   Cod Liver Oil OIL Take 15 mLs by mouth daily.   Coenzyme Q10 (COQ10) 200 MG CAPS Take 200 mg by mouth 3 (three) times a week.   fluticasone (FLONASE) 50 MCG/ACT nasal spray Place 2 sprays into both nostrils daily.   Garlic 1000 MG CAPS Take 1,000 mg by mouth 3 (three) times a week.   Ginkgo 60 MG TABS Take 60 mg by mouth 3 (three) times a week.    hydrOXYzine (ATARAX/VISTARIL) 10 MG tablet Take 1 tablet (10 mg total) by mouth 3 (three) times daily as needed for itching.   loratadine (CLARITIN) 10 MG tablet Take 1 tablet (10 mg total) by mouth daily.   memantine (NAMENDA) 10 MG tablet TAKE 1 TABLET BY MOUTH  DAILY   Multiple Vitamin (MULTIVITAMIN WITH MINERALS) TABS tablet Take 1 tablet by mouth daily.   sildenafil (VIAGRA) 50 MG tablet Take 1 tablet (50 mg total) by mouth daily as needed for erectile dysfunction.   simvastatin (ZOCOR) 20 MG tablet TAKE  1 TABLET BY MOUTH AT  BEDTIME   zinc gluconate 50 MG tablet Take 50 mg by mouth 3 (three) times a week.   No facility-administered encounter medications on file as of 07/12/2022.    Allergies (verified) No known allergies   History: Past Medical History:  Diagnosis Date   Arthritis    Family history of heart disease    Forgetfulness    Hemorrhoids    History of nuclear stress test 1996   negative   Sleep apnea    Past Surgical History:  Procedure Laterality Date   CATARACT EXTRACTION, BILATERAL     HEMORRHOID SURGERY N/A 05/17/2013   Procedure: HEMORRHOIDECTOMY;  Surgeon: Fabio Bering, MD;  Location: [facility];  Service: General;  Laterality: N/A;   INGUINAL HERNIA REPAIR Left 02/05/2017   Procedure: REPAIR LEFT INGUINAL HERNIA;  Surgeon: Violeta Gelinas, MD;  Location: [facility];   Service: General;  Laterality: Left;   INSERTION OF MESH Left 02/05/2017   Procedure: INSERTION OF MESH;  Surgeon: Violeta Gelinas, MD;  Location: [facility];  Service: General;  Laterality: Left;   KNEE ARTHROSCOPY Right    KNEE ARTHROSCOPY Left    Family History  Problem Relation Age of Onset   Heart disease Mother        MI @ 10, stroke   Stroke Mother    Heart disease Father        MI @ 50, CABG   Heart attack Brother    Heart attack Brother    Social History   Socioeconomic History   Marital status: Divorced    Spouse name: Not on file   Number of children: 2   Years of education: Not on file   Highest education level: Not on file  Occupational History   Occupation: retired principal     Associate Professor: RETIRED    Comment: [employer]  Tobacco Use   Smoking status: Former    Years: 6.00    Types: Cigarettes    Quit date: 10/20/1983    Years since quitting: 38.7   Smokeless tobacco: Never  Vaping Use   Vaping Use: Never used  Substance and Sexual Activity   Alcohol use: Yes    Alcohol/week: 1.0 standard drink of alcohol    Types: 1 Glasses of wine per week    Comment: drinks 1/2 glass of wine with dinner   Drug use: No   Sexual activity: Never  Other Topics Concern   Not on file  Social History Narrative   Lives in [place], [place].   Live alone.    Divorced. 2 children, Live in [place].   Not dating anyone.   Eats all food groups.   Wears seatbelt.    Social Determinants of Health   Financial Resource Strain: Low Risk  (07/11/2021)   Overall Financial Resource Strain (CARDIA)    Difficulty of Paying Living Expenses: Not hard at all  Food Insecurity: No Food Insecurity (07/11/2021)   Hunger Vital Sign    Worried About Running Out of Food in the Last Year: Never true    Ran Out of Food in the Last Year: Never true  Transportation Needs: No Transportation Needs (07/11/2021)   PRAPARE - Administrator, Civil Service (Medical): No    Lack of  Transportation (Non-Medical): No  Physical Activity: Sufficiently Active (07/11/2021)   Exercise Vital Sign    Days of Exercise per Week: 7 days    Minutes of Exercise per Session: 30 min  Stress: No Stress Concern Present (07/11/2021)   [place]    Feeling of Stress : Not at all  Social Connections: Moderately Integrated (07/11/2021)   Social Connection and Isolation Panel [NHANES]    Frequency of Communication with Friends and Family: More than three times a week    Frequency of Social Gatherings with Friends and Family: More than three times a week    Attends Religious Services: More than 4 times per year    Active Member of [employer] or Organizations: Yes    Attends Archivist Meetings: 1 to 4 times per year    Marital Status: Divorced    Tobacco Counseling Counseling given: Not Answered   Clinical Intake:                 Diabetic? No         Activities of Daily Living     No data to display           Patient Care Team: Lindell Spar, MD as PCP - General (Internal Medicine)  Indicate any recent Medical Services you may have received from other than [facility] providers in the past year (date may be approximate).     Assessment:   This is a routine wellness examination for [place].  Hearing/Vision screen No results found.  Dietary issues and exercise activities discussed:     Goals Addressed   None   Depression Screen    04/16/2022    9:01 AM 10/16/2021    9:20 AM 07/11/2021    1:51 PM 04/12/2021    9:07 AM 03/13/2021    2:39 PM 11/19/2019    8:04 AM 05/01/2018    8:07 AM  PHQ 2/9 Scores  PHQ - 2 Score 0 0 0 0 0 0 0  PHQ- 9 Score       1    Fall Risk    04/16/2022    9:01 AM 10/16/2021    9:20 AM 07/11/2021    1:51 PM 04/12/2021    9:07 AM 03/13/2021    2:39 PM  [place] in the past year? 0 0 0 0 0  Number falls in past yr: 0 0 0 0 0  Injury with Fall? 0 0 0 0  0  Risk for fall due to : No Fall Risks No Fall Risks No Fall Risks No Fall Risks No Fall Risks  Follow up Falls evaluation completed Falls evaluation completed Falls evaluation completed Falls evaluation completed Falls evaluation completed    FALL RISK PREVENTION PERTAINING TO THE HOME:  Any stairs in or around the home? Yes  If so, are there any without handrails? No  Home free of loose throw rugs in walkways, pet beds, electrical cords, etc? Yes  Adequate lighting in your home to reduce risk of falls? Yes   ASSISTIVE DEVICES UTILIZED TO PREVENT FALLS:  Life alert? Yes  Use of a cane, walker or w/c? Yes  Grab bars in the bathroom? Yes  Shower chair or bench in shower? No  Elevated toilet seat or a handicapped toilet? No    Cognitive Function:    07/11/2021    1:52 PM 11/19/2018    8:22 PM 03/31/2018    8:13 AM  MMSE - Mini Mental State Exam  Not completed: Unable to complete    Orientation to time  4 5  Orientation to Place  5 5  Registration  3 3  Attention/ Calculation  5 2  Recall  2 0  Language- name 2 objects  2 2  Language- repeat  1 1  Language- follow 3 step command  3 3  Language- read & follow direction  1 1  Write a sentence  1 1  Copy design  0 0  Total score  27 23        07/11/2021    1:52 PM  6CIT Screen  What Year? 0 points  What month? 0 points  What time? 0 points  Count back from 20 0 points  Months in reverse 0 points  Repeat phrase 0 points  Total Score 0 points    Immunizations Immunization History  Administered Date(s) Administered   Fluad Quad(high Dose 65+) 08/16/2020   Influenza, High Dose Seasonal PF 09/30/2017, 10/13/2018, 08/19/2019   Influenza-Unspecified 09/30/2017, 10/11/2021   Moderna SARS-COV2 Booster Vaccination 10/13/2020, 10/03/2021   Moderna Sars-Covid-2 Vaccination 01/22/2020, 02/22/2020   Pneumococcal Conjugate-13 09/30/2017   Pneumococcal Polysaccharide-23 04/06/2019   Tdap 10/23/2017    TDAP status: Up to  date  Flu Vaccine status: Up to date  Pneumococcal vaccine status: Up to date  Covid-19 vaccine status: Completed vaccines  Qualifies for Shingles Vaccine? Yes   Zostavax completed Yes   Shingrix Completed?: No.    Education has been provided regarding the importance of this vaccine. Patient has been advised to call insurance company to determine out of pocket expense if they have not yet received this vaccine. Advised may also receive vaccine at local pharmacy or Health Dept. Verbalized acceptance and understanding.  Screening Tests Health Maintenance  Topic Date Due   Zoster Vaccines- Shingrix (1 of 2) Never done   COVID-19 Vaccine (3 - Moderna series) 11/28/2021   INFLUENZA VACCINE  07/16/2022   TETANUS/TDAP  10/24/2027   Pneumonia Vaccine 38+ Years old  Completed   HPV VACCINES  Aged Out    Health Maintenance  Health Maintenance Due  Topic Date Due   Zoster Vaccines- Shingrix (1 of 2) Never done   COVID-19 Vaccine (3 - Moderna series) 11/28/2021    Colorectal cancer screening: No longer required.   Lung Cancer Screening: (Low Dose CT Chest recommended if Age 31-80 years, 30 pack-year currently smoking OR have quit w/in 15years.) does not qualify.   Additional Screening:  Hepatitis C Screening: does qualify; Completed   Vision Screening: Recommended annual ophthalmology exams for early detection of glaucoma and other disorders of the eye. Is the patient up to date with their annual eye exam? Scheduled Who is the provider or what is the name of the office in which the patient attends annual eye exams? [facility]  Dental Screening: Recommended annual dental exams for proper oral hygiene  Community Resource Referral / Chronic Care Management: CRR required this visit?  No   CCM required this visit?  No      Plan:     I have personally reviewed and noted the following in the patient's chart:   Medical and social history Use of alcohol, tobacco or illicit drugs   Current medications and supplements including opioid prescriptions. Patient is not currently taking opioid prescriptions. Functional ability and status Nutritional status Physical activity Advanced directives List of other physicians Hospitalizations, surgeries, and ER visits in previous 12 months Vitals Screenings to include cognitive, depression, and falls Referrals and appointments  In addition, I have reviewed and discussed with patient certain preventive protocols, quality metrics, and best practice recommendations. A written personalized care plan  for preventive services as well as general preventive health recommendations were provided to patient.     Johny Drilling, Max   07/12/2022   Nurse Notes:  Mr. Bachman , Thank you for taking time to come for your Medicare Wellness Visit. I appreciate your ongoing commitment to your health goals. Please review the following plan we discussed and let me know if I can assist you in the future.   These are the goals we discussed:  Goals      Patient Stated     Would like to just keep going         This is a list of the screening recommended for you and due dates:  Health Maintenance  Topic Date Due   Zoster (Shingles) Vaccine (1 of 2) Never done   COVID-19 Vaccine (3 - Moderna series) 11/28/2021   Flu Shot  07/16/2022   Tetanus Vaccine  10/24/2027   Pneumonia Vaccine  Completed   HPV Vaccine  Aged Out

## 2022-07-12 NOTE — Patient Instructions (Signed)
[name] , Thank you for taking time to come for your Medicare Wellness Visit. I appreciate your ongoing commitment to your health goals. Please review the following plan we discussed and let me know if I can assist you in the future.   Screening recommendations/referrals:  Recommended yearly ophthalmology/optometry visit for glaucoma screening and checkup Recommended yearly dental visit for hygiene and checkup  Vaccinations: Influenza vaccine: completed Pneumococcal vaccine: Completed Tdap vaccine: Completed Shingles vaccine: Getting the first of Aug  Advanced directives: bring paperwork with you to your next vist  Conditions/risks identified: falls, hypertension  Next appointment: 1 year  Preventive Care 90 Years and Older, Male Preventive care refers to lifestyle choices and visits with your health care provider that can promote health and wellness. What does preventive care include? A yearly physical exam. This is also called an annual well check. Dental exams once or twice a year. Routine eye exams. Ask your health care provider how often you should have your eyes checked. Personal lifestyle choices, including: Daily care of your teeth and gums. Regular physical activity. Eating a healthy diet. Avoiding tobacco and drug use. Limiting alcohol use. Practicing safe sex. Taking low doses of aspirin every day. Taking vitamin and mineral supplements as recommended by your health care provider. What happens during an annual well check? The services and screenings done by your health care provider during your annual well check will depend on your age, overall health, lifestyle risk factors, and family history of disease. Counseling  Your health care provider may ask you questions about your: Alcohol use. Tobacco use. Drug use. Emotional well-being. Home and relationship well-being. Sexual activity. Eating habits. History of falls. Memory and ability to understand  (cognition). Work and work Astronomer. Screening  You may have the following tests or measurements: Height, weight, and BMI. Blood pressure. Lipid and cholesterol levels. These may be checked every 5 years, or more frequently if you are over 49 years old. Skin check. Lung cancer screening. You may have this screening every year starting at age 67 if you have a 30-pack-year history of smoking and currently smoke or have quit within the past 15 years. Fecal occult blood test (FOBT) of the stool. You may have this test every year starting at age 73. Flexible sigmoidoscopy or colonoscopy. You may have a sigmoidoscopy every 5 years or a colonoscopy every 10 years starting at age 56. Prostate cancer screening. Recommendations will vary depending on your family history and other risks. Hepatitis C blood test. Hepatitis B blood test. Sexually transmitted disease (STD) testing. Diabetes screening. This is done by checking your blood sugar (glucose) after you have not eaten for a while (fasting). You may have this done every 1-3 years. Abdominal aortic aneurysm (AAA) screening. You may need this if you are a current or former smoker. Osteoporosis. You may be screened starting at age 74 if you are at high risk. Talk with your health care provider about your test results, treatment options, and if necessary, the need for more tests. Vaccines  Your health care provider may recommend certain vaccines, such as: Influenza vaccine. This is recommended every year. Tetanus, diphtheria, and acellular pertussis (Tdap, Td) vaccine. You may need a Td booster every 10 years. Zoster vaccine. You may need this after age 40. Pneumococcal 13-valent conjugate (PCV13) vaccine. One dose is recommended after age 71. Pneumococcal polysaccharide (PPSV23) vaccine. One dose is recommended after age 37. Talk to your health care provider about which screenings and vaccines you need and  how often you need them. This  information is not intended to replace advice given to you by your health care provider. Make sure you discuss any questions you have with your health care provider. Document Released: 12/29/2015 Document Revised: 08/21/2016 Document Reviewed: 10/03/2015 Elsevier Interactive Patient Education  2017 [employer] Prevention in the Home Falls can cause injuries. They can happen to people of all ages. There are many things you can do to make your home safe and to help prevent falls. What can I do on the outside of my home? Regularly fix the edges of walkways and driveways and fix any cracks. Remove anything that might make you trip as you walk through a door, such as a raised step or threshold. Trim any bushes or trees on the path to your home. Use bright outdoor lighting. Clear any walking paths of anything that might make someone trip, such as rocks or tools. Regularly check to see if handrails are loose or broken. Make sure that both sides of any steps have handrails. Any raised decks and porches should have guardrails on the edges. Have any leaves, snow, or ice cleared regularly. Use sand or salt on walking paths during winter. Clean up any spills in your garage right away. This includes oil or grease spills. What can I do in the bathroom? Use night lights. Install grab bars by the toilet and in the tub and shower. Do not use towel bars as grab bars. Use non-skid mats or decals in the tub or shower. If you need to sit down in the shower, use a plastic, non-slip stool. Keep the floor dry. Clean up any water that spills on the floor as soon as it happens. Remove soap buildup in the tub or shower regularly. Attach bath mats securely with double-sided non-slip rug tape. Do not have throw rugs and other things on the floor that can make you trip. What can I do in the bedroom? Use night lights. Make sure that you have a light by your bed that is easy to reach. Do not use any sheets or  blankets that are too big for your bed. They should not hang down onto the floor. Have a firm chair that has side arms. You can use this for support while you get dressed. Do not have throw rugs and other things on the floor that can make you trip. What can I do in the kitchen? Clean up any spills right away. Avoid walking on wet floors. Keep items that you use a lot in easy-to-reach places. If you need to reach something above you, use a strong step stool that has a grab bar. Keep electrical cords out of the way. Do not use floor polish or wax that makes floors slippery. If you must use wax, use non-skid floor wax. Do not have throw rugs and other things on the floor that can make you trip. What can I do with my stairs? Do not leave any items on the stairs. Make sure that there are handrails on both sides of the stairs and use them. Fix handrails that are broken or loose. Make sure that handrails are as long as the stairways. Check any carpeting to make sure that it is firmly attached to the stairs. Fix any carpet that is loose or worn. Avoid having throw rugs at the top or bottom of the stairs. If you do have throw rugs, attach them to the floor with carpet tape. Make sure that you have  a light switch at the top of the stairs and the bottom of the stairs. If you do not have them, ask someone to add them for you. What else can I do to help prevent falls? Wear shoes that: Do not have high heels. Have rubber bottoms. Are comfortable and fit you well. Are closed at the toe. Do not wear sandals. If you use a stepladder: Make sure that it is fully opened. Do not climb a closed stepladder. Make sure that both sides of the stepladder are locked into place. Ask someone to hold it for you, if possible. Clearly mark and make sure that you can see: Any grab bars or handrails. First and last steps. Where the edge of each step is. Use tools that help you move around (mobility aids) if they are  needed. These include: Canes. Walkers. Scooters. Crutches. Turn on the lights when you go into a dark area. Replace any light bulbs as soon as they burn out. Set up your furniture so you have a clear path. Avoid moving your furniture around. If any of your floors are uneven, fix them. If there are any pets around you, be aware of where they are. Review your medicines with your doctor. Some medicines can make you feel dizzy. This can increase your chance of falling. Ask your doctor what other things that you can do to help prevent falls. This information is not intended to replace advice given to you by your health care provider. Make sure you discuss any questions you have with your health care provider. Document Released: 09/28/2009 Document Revised: 05/09/2016 Document Reviewed: 01/06/2015 Elsevier Interactive Patient Education  2017 [employer].

## 2022-09-15 ENCOUNTER — Other Ambulatory Visit: Payer: Self-pay | Admitting: Internal Medicine

## 2022-10-16 ENCOUNTER — Other Ambulatory Visit: Payer: Self-pay | Admitting: Internal Medicine

## 2022-10-16 ENCOUNTER — Telehealth: Payer: Self-pay | Admitting: *Deleted

## 2022-10-16 DIAGNOSIS — I10 Essential (primary) hypertension: Secondary | ICD-10-CM

## 2022-10-16 DIAGNOSIS — E782 Mixed hyperlipidemia: Secondary | ICD-10-CM

## 2022-10-16 NOTE — Telephone Encounter (Signed)
Patient called requesting labs before upcoming appt on 11/3 please advise if you would like to order Pt would like call back to confirm will call once ordered

## 2022-10-18 ENCOUNTER — Encounter: Payer: Self-pay | Admitting: Internal Medicine

## 2022-10-18 ENCOUNTER — Ambulatory Visit (INDEPENDENT_AMBULATORY_CARE_PROVIDER_SITE_OTHER): Payer: Medicare Other | Admitting: Internal Medicine

## 2022-10-18 VITALS — BP 128/84 | HR 66 | Resp 18 | Ht 69.0 in | Wt 170.6 lb

## 2022-10-18 DIAGNOSIS — E782 Mixed hyperlipidemia: Secondary | ICD-10-CM | POA: Diagnosis not present

## 2022-10-18 DIAGNOSIS — I10 Essential (primary) hypertension: Secondary | ICD-10-CM | POA: Diagnosis not present

## 2022-10-18 DIAGNOSIS — Z8249 Family history of ischemic heart disease and other diseases of the circulatory system: Secondary | ICD-10-CM

## 2022-10-18 DIAGNOSIS — F02A0 Dementia in other diseases classified elsewhere, mild, without behavioral disturbance, psychotic disturbance, mood disturbance, and anxiety: Secondary | ICD-10-CM | POA: Diagnosis not present

## 2022-10-18 DIAGNOSIS — G309 Alzheimer's disease, unspecified: Secondary | ICD-10-CM

## 2022-10-18 DIAGNOSIS — G4733 Obstructive sleep apnea (adult) (pediatric): Secondary | ICD-10-CM

## 2022-10-18 DIAGNOSIS — M170 Bilateral primary osteoarthritis of knee: Secondary | ICD-10-CM

## 2022-10-18 NOTE — Assessment & Plan Note (Signed)
On [place] for ADLs and IADLs.

## 2022-10-18 NOTE — Progress Notes (Signed)
Established Patient Office Visit  Subjective:  Patient ID: Chris Taylor, male    DOB: 1941-08-01  Age: 81 y.o. MRN: [id number]  CC:  Chief Complaint  Patient presents with   Follow-up    Follow up HTN and OSA    HPI Chris Taylor is a 81 y.o. male with past medical history of HTN, OSA on CPAP, HLD and mild cognitive impairment who presents for f/u of his chronic medical conditions.  BP is well-controlled. Takes medications regularly. Patient denies headache, dizziness, chest pain, dyspnea or palpitations. He has family history of premature CAD and is concerned about his risk. His lipid profile has been well-controlled. His last EKG in 2018 showed concern for left axis deviation.  He takes Namenda for mild cognitive impairment.  He is independent for ADLs currently.  Denies any agitation or anhedonia currently.       Past Medical History:  Diagnosis Date   Arthritis    Family history of heart disease    Forgetfulness    Hemorrhoids    History of nuclear stress test 1996   negative   Sleep apnea     Past Surgical History:  Procedure Laterality Date   CATARACT EXTRACTION, BILATERAL     HEMORRHOID SURGERY N/A 05/17/2013   Procedure: HEMORRHOIDECTOMY;  Surgeon: Donato Heinz, MD;  Location: [facility];  Service: General;  Laterality: N/A;   INGUINAL HERNIA REPAIR Left 02/05/2017   Procedure: REPAIR LEFT INGUINAL HERNIA;  Surgeon: Georganna Skeans, MD;  Location: [facility];  Service: General;  Laterality: Left;   INSERTION OF MESH Left 02/05/2017   Procedure: INSERTION OF MESH;  Surgeon: Georganna Skeans, MD;  Location: [facility];  Service: General;  Laterality: Left;   KNEE ARTHROSCOPY Right    KNEE ARTHROSCOPY Left     Family History  Problem Relation Age of Onset   Heart disease Mother        MI @ 64, stroke   Stroke Mother    Heart disease Father        MI @ 77, CABG   Heart attack Brother    Heart attack Brother     Social History   Socioeconomic History    Marital status: Divorced    Spouse name: Not on file   Number of children: 2   Years of education: Not on file   Highest education level: Not on file  Occupational History   Occupation: retired principal     Fish farm manager: RETIRED    Comment: [employer] Use   Smoking status: Former    Years: 6.00    Types: Cigarettes    Quit date: 10/20/1983    Years since quitting: 39.0   Smokeless tobacco: Never  Vaping Use   Vaping Use: Never used  Substance and Sexual Activity   Alcohol use: Yes    Alcohol/week: 1.0 standard drink of alcohol    Types: 1 Glasses of wine per week    Comment: drinks 1/2 glass of wine with dinner   Drug use: No   Sexual activity: Never  Other Topics Concern   Not on file  Social History Narrative   Lives in [place], [place].   Live alone.    Divorced. 2 children, Live in [place].   Not dating anyone.   Eats all food groups.   Wears seatbelt.    Social Determinants of Health   Financial Resource Strain: Low Risk  (07/12/2022)   Overall Financial Resource Strain (CARDIA)  Difficulty of Paying Living Expenses: Not hard at all  Food Insecurity: No Food Insecurity (07/12/2022)   Hunger Vital Sign    Worried About Running Out of Food in the Last Year: Never true    Ran Out of Food in the Last Year: Never true  Transportation Needs: No Transportation Needs (07/12/2022)   PRAPARE - Hydrologist (Medical): No    Lack of Transportation (Non-Medical): No  Physical Activity: Sufficiently Active (07/12/2022)   Exercise Vital Sign    Days of Exercise per Week: 5 days    Minutes of Exercise per Session: 50 min  Stress: No Stress Concern Present (07/12/2022)   [address]    Feeling of Stress : Not at all  Social Connections: Moderately Integrated (07/12/2022)   Social Connection and Isolation Panel [NHANES]    Frequency of Communication with Friends and  Family: More than three times a week    Frequency of Social Gatherings with Friends and Family: Twice a week    Attends Religious Services: More than 4 times per year    Active Member of [employer] or Organizations: Yes    Attends Archivist Meetings: 1 to 4 times per year    Marital Status: Divorced  Human resources officer Violence: Not At Risk (07/12/2022)   Humiliation, Afraid, Rape, and Kick questionnaire    Fear of Current or Ex-Partner: No    Emotionally Abused: No    Physically Abused: No    Sexually Abused: No    Outpatient Medications Prior to Visit  Medication Sig Dispense Refill   albuterol (VENTOLIN HFA) 108 (90 Base) MCG/ACT inhaler Inhale 2 puffs into the lungs every 6 (six) hours as needed for wheezing or shortness of breath. 8 g 0   amLODipine (NORVASC) 5 MG tablet TAKE 1 TABLET BY MOUTH  DAILY 90 tablet 3   aspirin EC 81 MG tablet Take 81 mg by mouth daily.     Cod Liver Oil OIL Take 15 mLs by mouth daily.     Coenzyme Q10 (COQ10) 200 MG CAPS Take 200 mg by mouth 3 (three) times a week.     fluticasone (FLONASE) 50 MCG/ACT nasal spray Place 2 sprays into both nostrils daily. 16 g 6   Garlic 6861 MG CAPS Take 1,000 mg by mouth 3 (three) times a week.     Ginkgo 60 MG TABS Take 60 mg by mouth 3 (three) times a week.      hydrOXYzine (ATARAX/VISTARIL) 10 MG tablet Take 1 tablet (10 mg total) by mouth 3 (three) times daily as needed for itching. 30 tablet 0   loratadine (CLARITIN) 10 MG tablet Take 1 tablet (10 mg total) by mouth daily. 90 tablet 3   memantine (NAMENDA) 10 MG tablet TAKE 1 TABLET BY MOUTH DAILY 100 tablet 2   Multiple Vitamin (MULTIVITAMIN WITH MINERALS) TABS tablet Take 1 tablet by mouth daily.     simvastatin (ZOCOR) 20 MG tablet TAKE 1 TABLET BY MOUTH AT  BEDTIME 90 tablet 3   zinc gluconate 50 MG tablet Take 50 mg by mouth 3 (three) times a week.     sildenafil (VIAGRA) 50 MG tablet Take 1 tablet (50 mg total) by mouth daily as needed for erectile  dysfunction. (Patient not taking: Reported on 10/18/2022) 60 tablet 0   No facility-administered medications prior to visit.    Allergies  Allergen Reactions   No Known Allergies  ROS Review of Systems  Constitutional:  Negative for chills and fever.  HENT:  Negative for congestion and sore throat.   Eyes:  Negative for pain and discharge.  Respiratory:  Negative for cough and shortness of breath.   Cardiovascular:  Negative for chest pain and palpitations.  Gastrointestinal:  Negative for constipation, diarrhea, nausea and vomiting.  Endocrine: Negative for polydipsia and polyuria.  Genitourinary:  Negative for dysuria and hematuria.  Musculoskeletal:  Positive for arthralgias (knee) and neck pain. Negative for neck stiffness.  Skin:  Negative for rash.  Neurological:  Negative for dizziness, weakness, numbness and headaches.  Psychiatric/Behavioral:  Negative for agitation and behavioral problems.       Objective:    Physical Exam Vitals reviewed.  Constitutional:      General: He is not in acute distress.    Appearance: He is not diaphoretic.  HENT:     Head: Normocephalic and atraumatic.     Nose: Nose normal.     Mouth/Throat:     Mouth: Mucous membranes are moist.  Eyes:     General: No scleral icterus.    Extraocular Movements: Extraocular movements intact.  Neck:     Vascular: No carotid bruit.  Cardiovascular:     Rate and Rhythm: Normal rate and regular rhythm.     Pulses: Normal pulses.     Heart sounds: Normal heart sounds. No murmur heard. Pulmonary:     Breath sounds: Normal breath sounds. No wheezing or rales.  Musculoskeletal:        General: Swelling (B/l knee, PIP and DIP joints of b/l hands) present.     Cervical back: Neck supple. No tenderness.     Right lower leg: No edema.     Left lower leg: No edema.     Comments: Heberden and Bouchard's nodes seen on b/l hands  Skin:    General: Skin is warm.     Findings: No rash.  Neurological:      General: No focal deficit present.     Mental Status: He is alert and oriented to person, place, and time.     Sensory: No sensory deficit.     Motor: No weakness.  Psychiatric:        Mood and Affect: Mood normal.        Behavior: Behavior normal.     BP 128/84 (BP Location: Left Arm, Patient Position: Sitting, Cuff Size: Normal)   Pulse 66   Resp 18   Ht _0  (1.753 m)   Wt 170 lb 9.6 oz (77.4 kg)   SpO2 98%   BMI 25.19 kg/m  Wt Readings from Last 3 Encounters:  10/18/22 170 lb 9.6 oz (77.4 kg)  04/16/22 175 lb (79.4 kg)  10/16/21 177 lb 0.6 oz (80.3 kg)    Lab Results  Component Value Date   TSH 2.930 04/16/2022   Lab Results  Component Value Date   WBC 4.2 04/16/2022   HGB 15.1 04/16/2022   HCT 46.2 04/16/2022   MCV 97 04/16/2022   PLT 194 04/16/2022   Lab Results  Component Value Date   NA 141 04/16/2022   K 4.1 04/16/2022   CO2 23 04/16/2022   GLUCOSE 88 04/16/2022   BUN 10 04/16/2022   CREATININE 1.02 04/16/2022   BILITOT 0.5 04/16/2022   ALKPHOS 66 04/16/2022   AST 37 04/16/2022   ALT 29 04/16/2022   PROT 7.2 04/16/2022   ALBUMIN 4.6 04/16/2022   CALCIUM 9.3 04/16/2022  EGFR 74 04/16/2022   Lab Results  Component Value Date   CHOL 180 04/16/2022   Lab Results  Component Value Date   HDL 85 04/16/2022   Lab Results  Component Value Date   LDLCALC 84 04/16/2022   Lab Results  Component Value Date   TRIG 55 04/16/2022   Lab Results  Component Value Date   CHOLHDL 2.1 04/16/2022   Lab Results  Component Value Date   HGBA1C 5.6 04/16/2022      Assessment & Plan:   Problem List Items Addressed This Visit       Cardiovascular and Mediastinum   Essential hypertension, benign - Primary    BP Readings from Last 1 Encounters:  10/18/22 128/84  Well-controlled with Amlodipine 5 mg QD - considering his age 24 for compliance with the medications Advised DASH diet and moderate exercise/walking, at least 150 mins/week       Relevant Orders   Basic Metabolic Panel (BMET)   EKG 12-Lead (Completed)     Respiratory   OSA (obstructive sleep apnea)    Uses CPAP device at nighttime Had dental appliance, but did not tolerate it        Nervous and Auditory   Dementia ([place])    On Namenda Independent for ADLs and IADLs.        Musculoskeletal and Integument   OA (osteoarthritis) of knee    Advised to take Tylenol PRN Voltaren gel PRN        Other   Family history of premature CAD    Patient's BP is WNL His lipid profile is also at goal He has very active lifestyle, goes to [facility] daily EKG: Sinus rhythm. No signs of active ischemia.  No need for further evaluation for now      Mixed hyperlipidemia    On statin Will check lipid profile      Relevant Orders   Lipid Profile    No orders of the defined types were placed in this encounter.   Follow-up: Return in about 6 months (around 04/18/2023) for Annual physical.    Lindell Spar, MD

## 2022-10-18 NOTE — Patient Instructions (Signed)
Please continue taking medications as prescribed.  Please continue to follow low salt diet and perform moderate exercise/walking as tolerated. 

## 2022-10-18 NOTE — Assessment & Plan Note (Signed)
Advised to take Tylenol PRN Voltaren gel PRN

## 2022-10-18 NOTE — Assessment & Plan Note (Signed)
Uses CPAP device at nighttime Had dental appliance, but did not tolerate it

## 2022-10-18 NOTE — Assessment & Plan Note (Signed)
Patient's BP is WNL His lipid profile is also at goal He has very active lifestyle, goes to [facility] daily EKG: Sinus rhythm. No signs of active ischemia.  No need for further evaluation for now

## 2022-10-18 NOTE — Assessment & Plan Note (Signed)
On statin Will check lipid profile

## 2022-10-18 NOTE — Assessment & Plan Note (Signed)
BP Readings from Last 1 Encounters:  10/18/22 128/84   Well-controlled with Amlodipine 5 mg QD - considering his age Counseled for compliance with the medications Advised DASH diet and moderate exercise/walking, at least 150 mins/week

## 2022-10-19 LAB — BASIC METABOLIC PANEL
BUN/Creatinine Ratio: 9 — ABNORMAL LOW (ref 10–24)
BUN: 10 mg/dL (ref 8–27)
CO2: 23 mmol/L (ref 20–29)
Calcium: 9.5 mg/dL (ref 8.6–10.2)
Chloride: 104 mmol/L (ref 96–106)
Creatinine, Ser: 1.06 mg/dL (ref 0.76–1.27)
Glucose: 100 mg/dL — ABNORMAL HIGH (ref 70–99)
Potassium: 4.1 mmol/L (ref 3.5–5.2)
Sodium: 141 mmol/L (ref 134–144)
eGFR: 71 mL/min/{1.73_m2} (ref >59)

## 2022-10-19 LAB — LIPID PANEL
Chol/HDL Ratio: 2 ratio (ref 0.0–5.0)
Cholesterol, Total: 188 mg/dL (ref 100–199)
HDL: 96 mg/dL (ref >39)
LDL Chol Calc (NIH): 83 mg/dL (ref 0–99)
Triglycerides: 46 mg/dL (ref 0–149)
VLDL Cholesterol Cal: 9 mg/dL (ref 5–40)

## 2022-12-19 ENCOUNTER — Other Ambulatory Visit: Payer: Self-pay | Admitting: Internal Medicine

## 2023-01-13 ENCOUNTER — Ambulatory Visit
Admission: EM | Admit: 2023-01-13 | Discharge: 2023-01-13 | Disposition: A | Discharge status: Home / Self Care | Payer: Medicare Other | Attending: Internal Medicine | Admitting: Internal Medicine

## 2023-01-13 ENCOUNTER — Ambulatory Visit (INDEPENDENT_AMBULATORY_CARE_PROVIDER_SITE_OTHER): Payer: Medicare Other

## 2023-01-13 DIAGNOSIS — R051 Acute cough: Secondary | ICD-10-CM

## 2023-01-13 DIAGNOSIS — U071 COVID-19: Secondary | ICD-10-CM | POA: Insufficient documentation

## 2023-01-13 DIAGNOSIS — R6889 Other general symptoms and signs: Secondary | ICD-10-CM

## 2023-01-13 DIAGNOSIS — R509 Fever, unspecified: Secondary | ICD-10-CM | POA: Diagnosis not present

## 2023-01-13 DIAGNOSIS — R059 Cough, unspecified: Secondary | ICD-10-CM | POA: Diagnosis not present

## 2023-01-13 MED ORDER — BENZONATATE 200 MG PO CAPS: 200.0000 mg | ORAL_CAPSULE | Freq: Three times a day (TID) | ORAL | 0 refills | Status: DC | PRN

## 2023-01-13 MED ORDER — OSELTAMIVIR PHOSPHATE 75 MG PO CAPS: 75.0000 mg | ORAL_CAPSULE | Freq: Two times a day (BID) | ORAL | 0 refills | Status: DC

## 2023-01-13 NOTE — ED Provider Notes (Addendum)
[facility]    CSN: [id number] Arrival date & time: 01/13/23  1255      History   Chief Complaint Chief Complaint  Patient presents with   Cough    HPI Chris Taylor is a 82 y.o. male who presents with onset of chest and nose congestion x 2 days. OTC meds not helping. Has been sweating a lot. His cough is productive with clear mucous. Has had his flu shot and 3 Covid shots. He has never had Covid infection that he knows of. Has not been around anyone sick since he does not leave his home. Today he had mild nose congestion. Last night felt SOB when he was done having coughing fits. Denies hx of asthma or COPD, he does not smoke.    Past Medical History:  Diagnosis Date   Arthritis    Family history of heart disease    Forgetfulness    Hemorrhoids    History of nuclear stress test 1996   negative   Sleep apnea     Patient Active Problem List   Diagnosis Date Noted   Encounter for general adult medical examination with abnormal findings 04/16/2022   Hx of seasonal allergies 03/13/2021   OA (osteoarthritis) of knee 08/07/2020   Essential hypertension, benign 11/19/2019   ED (erectile dysfunction) 10/15/2019   Dementia ([place]) 04/08/2019   OSA (obstructive sleep apnea) 04/08/2019   Mixed hyperlipidemia 04/08/2019   S/P inguinal hernia repair 02/05/2017   Abnormal EKG 10/19/2013   Family history of premature CAD 10/19/2013    Past Surgical History:  Procedure Laterality Date   CATARACT EXTRACTION, BILATERAL     HEMORRHOID SURGERY N/A 05/17/2013   Procedure: HEMORRHOIDECTOMY;  Surgeon: Donato Heinz, MD;  Location: [facility];  Service: General;  Laterality: N/A;   INGUINAL HERNIA REPAIR Left 02/05/2017   Procedure: REPAIR LEFT INGUINAL HERNIA;  Surgeon: Georganna Skeans, MD;  Location: [facility];  Service: General;  Laterality: Left;   INSERTION OF MESH Left 02/05/2017   Procedure: INSERTION OF MESH;  Surgeon: Georganna Skeans, MD;  Location: [facility];  Service: General;   Laterality: Left;   KNEE ARTHROSCOPY Right    KNEE ARTHROSCOPY Left        Home Medications    Prior to Admission medications   Medication Sig Start Date End Date Taking? Authorizing Provider  benzonatate (TESSALON) 200 MG capsule Take 1 capsule (200 mg total) by mouth 3 (three) times daily as needed for cough. 01/13/23  Yes Rodriguez-Southworth, Sunday Spillers, PA-C  oseltamivir (TAMIFLU) 75 MG capsule Take 1 capsule (75 mg total) by mouth every 12 (twelve) hours. 01/13/23  Yes Rodriguez-Southworth, Sunday Spillers, PA-C  albuterol (VENTOLIN HFA) 108 (90 Base) MCG/ACT inhaler Inhale 2 puffs into the lungs every 6 (six) hours as needed for wheezing or shortness of breath. 02/29/20   Annie Main, FNP  amLODipine (NORVASC) 5 MG tablet TAKE 1 TABLET BY MOUTH DAILY 12/20/22   Lindell Spar, MD  aspirin EC 81 MG tablet Take 81 mg by mouth daily.    [provider]  Cod Liver Oil OIL Take 15 mLs by mouth daily.    [provider]  Coenzyme Q10 (COQ10) 200 MG CAPS Take 200 mg by mouth 3 (three) times a week.    [provider]  fluticasone (FLONASE) 50 MCG/ACT nasal spray Place 2 sprays into both nostrils daily. 02/29/20   Ishmael Holter A, FNP  Garlic 2671 MG CAPS Take 1,000 mg by mouth 3 (three)  times a week.    [provider]  Ginkgo 60 MG TABS Take 60 mg by mouth 3 (three) times a week.     [provider]  hydrOXYzine (ATARAX/VISTARIL) 10 MG tablet Take 1 tablet (10 mg total) by mouth 3 (three) times daily as needed for itching. 03/22/20   Salley Scarlet, MD  loratadine (CLARITIN) 10 MG tablet Take 1 tablet (10 mg total) by mouth daily. 02/25/20   Salley Scarlet, MD  memantine (NAMENDA) 10 MG tablet TAKE 1 TABLET BY MOUTH DAILY 09/16/22   Anabel Halon, MD  Multiple Vitamin (MULTIVITAMIN WITH MINERALS) TABS tablet Take 1 tablet by mouth daily.    [provider]  simvastatin (ZOCOR) 20 MG tablet TAKE 1 TABLET BY MOUTH AT  BEDTIME 04/22/22   Anabel Halon, MD  zinc gluconate 50 MG tablet Take 50 mg by mouth 3 (three) times a week.    [provider]    Family History Family History  Problem Relation Age of Onset   Heart disease Mother        MI @ 65, stroke   Stroke Mother    Heart disease Father        MI @ 73, CABG   Heart attack Brother    Heart attack Brother     Social History Social History   Tobacco Use   Smoking status: Former    Years: 6.00    Types: Cigarettes    Quit date: 10/20/1983    Years since quitting: 39.2   Smokeless tobacco: Never  Vaping Use   Vaping Use: Never used  Substance Use Topics   Alcohol use: Yes    Alcohol/week: 1.0 standard drink of alcohol    Types: 1 Glasses of wine per week    Comment: drinks 1/2 glass of wine with dinner   Drug use: No     Allergies   No known allergies   Review of Systems Review of Systems  Constitutional:  Positive for appetite change, chills and diaphoresis. Negative for activity change and fever.  HENT:  Positive for postnasal drip. Negative for congestion, ear discharge, ear pain, rhinorrhea and sore throat.   Eyes:  Negative for discharge.  Respiratory:  Positive for cough. Negative for chest tightness and wheezing.   Cardiovascular:  Negative for chest pain.  Musculoskeletal:  Positive for myalgias.  Neurological:  Positive for headaches.  Hematological:  Negative for adenopathy.     Physical Exam Triage Vital Signs ED Triage Vitals  Enc Vitals Group     BP 01/13/23 1359 (!) 150/79     Pulse Rate 01/13/23 1359 86     Resp 01/13/23 1359 18     Temp 01/13/23 1359 99.7 F (37.6 C)     Temp Source 01/13/23 1359 Oral     SpO2 01/13/23 1359 95 %     Weight --      Height --      Head Circumference --      Peak Flow --      Pain Score 01/13/23 1400 0     Pain Loc --      Pain Edu? --      Excl. in GC? --    No data found.  Updated Vital Signs BP (!) 150/79 (BP Location: Right Arm)   Pulse 86   Temp 99.7 F (37.6 C)  (Oral)   Resp 18   SpO2 95%   Visual Acuity Right Eye Distance:  Left Eye Distance:   Bilateral Distance:    Right Eye Near:   Left Eye Near:    Bilateral Near:       Physical Exam Vitals signs and nursing note reviewed.  Constitutional:      General: He is not in acute dist. He is not ill-appearing, toxic-appearing or diaphoretic.  HENT:     Head: Normocephalic.     Right Ear: Tympanic membrane, ear canal and external ear normal.     Left Ear: Tympanic membrane, ear canal and external ear normal.     Nose: Nose normal.     Mouth/Throat: clear    Mouth: Mucous membranes are moist.  Eyes:     General: No scleral icterus.       Right eye: No discharge.        Left eye: No discharge.     Conjunctiva/sclera: Conjunctivae normal.  Neck:     Musculoskeletal: Neck supple. No neck rigidity.  Cardiovascular:     Rate and Rhythm: Normal rate and regular rhythm.     Heart sounds: No murmur.  Pulmonary:     Effort: Pulmonary effort is normal.     Breath sounds: Normal breath sounds.  Musculoskeletal: Normal range of motion.  Lymphadenopathy:     Cervical: No cervical adenopathy.  Skin:    General: Skin is warm and and little clammy. Has sweat beads on his forehead    Coloration: Skin is not jaundiced.     Findings: No rash.  Neurological:     Mental Status: He is alert and oriented to person, place, and time.     Gait: Gait normal.  Psychiatric:        Mood and Affect: Mood normal.        Behavior: Behavior normal.        Thought Content: Thought content normal.        Judgment: Judgment normal.   UC Treatments / Results  Labs (all labs ordered are listed, but only abnormal results are displayed) Labs Reviewed  SARS CORONAVIRUS 2 (TAT 6-24 HRS)    EKG   Radiology DG Chest 2 View  Result Date: 01/13/2023 CLINICAL DATA:  Fever and cough EXAM: CHEST - 2 VIEW COMPARISON:  Chest x-ray dated December 31, 2007 FINDINGS: The heart size and mediastinal contours are  within normal limits. Both lungs are clear. The visualized skeletal structures are unremarkable. IMPRESSION: No active cardiopulmonary disease. Electronically Signed   By: Allegra Lai M.D.   On: 01/13/2023 14:26    Procedures Procedures (including critical care time)  Medications Ordered in UC Medications - No data to display  Initial Impression / Assessment and Plan / UC Course  I have reviewed the triage vital signs and the nursing notes.  Pertinent  imaging results that were available during my care of the patient were reviewed by me and considered in my medical decision making (see chart for details).  We are out of Flu test and since he is has flu like symptoms, and is withint the 48 hour window, I started him on Tamiflu as noted. I also placed him on Tessalon perless as noted.  Flu like symptoms Covid test is pending.  See instructions.    Final Clinical Impressions(s) / UC Diagnoses   Final diagnoses:  Acute cough  Flu-like symptoms     Discharge Instructions      Your chest xray is negative for pneumonia. We are out of flu test, but since you have flu like  symptoms and your xray is normal, I will get you started on the Flu medicine.   Take the Flu medication starting today since you are within the 48 hour window to benefit from this medication If the covid test is positive, we will call you and get you started on the Paxlovid since your kidney function was normal in November last year.      ED Prescriptions     Medication Sig Dispense Auth. Provider   benzonatate (TESSALON) 200 MG capsule Take 1 capsule (200 mg total) by mouth 3 (three) times daily as needed for cough. 30 capsule Rodriguez-Southworth, Sunday Spillers, PA-C   oseltamivir (TAMIFLU) 75 MG capsule Take 1 capsule (75 mg total) by mouth every 12 (twelve) hours. 10 capsule Rodriguez-Southworth, Sunday Spillers, PA-C      PDMP not reviewed this encounter.   Shelby Mattocks, PA-C 01/13/23 1447     Rodriguez-Southworth, [place], PA-C 01/13/23 1458

## 2023-01-13 NOTE — ED Triage Notes (Signed)
Pt reports cough and chest congestion, nasal congestion x 2 days. Delsym and castor oil gives no relief

## 2023-01-13 NOTE — Discharge Instructions (Addendum)
Your chest xray is negative for pneumonia. We are out of flu test, but since you have flu like symptoms and your xray is normal, I will get you started on the Flu medicine.   Take the Flu medication starting today since you are within the 48 hour window to benefit from this medication If the covid test is positive, we will call you and get you started on the Paxlovid since your kidney function was normal in November last year.

## 2023-01-14 LAB — SARS CORONAVIRUS 2 (TAT 6-24 HRS): SARS Coronavirus 2: POSITIVE — AB

## 2023-01-15 ENCOUNTER — Telehealth (HOSPITAL_COMMUNITY): Payer: Self-pay | Admitting: Emergency Medicine

## 2023-01-15 MED ORDER — NIRMATRELVIR/RITONAVIR (PAXLOVID)TABLET: 3.0000 | ORAL_TABLET | Freq: Two times a day (BID) | ORAL | 0 refills | Status: AC

## 2023-02-07 DIAGNOSIS — G4733 Obstructive sleep apnea (adult) (pediatric): Secondary | ICD-10-CM | POA: Diagnosis not present

## 2023-02-14 ENCOUNTER — Other Ambulatory Visit: Payer: Self-pay | Admitting: Internal Medicine

## 2023-02-14 DIAGNOSIS — E782 Mixed hyperlipidemia: Secondary | ICD-10-CM

## 2023-04-22 ENCOUNTER — Encounter: Payer: Self-pay | Admitting: Internal Medicine

## 2023-04-22 ENCOUNTER — Ambulatory Visit (INDEPENDENT_AMBULATORY_CARE_PROVIDER_SITE_OTHER): Payer: Medicare Other | Admitting: Internal Medicine

## 2023-04-22 VITALS — BP 130/72 | HR 73 | Ht 69.0 in | Wt 170.2 lb

## 2023-04-22 DIAGNOSIS — R739 Hyperglycemia, unspecified: Secondary | ICD-10-CM | POA: Diagnosis not present

## 2023-04-22 DIAGNOSIS — N529 Male erectile dysfunction, unspecified: Secondary | ICD-10-CM

## 2023-04-22 DIAGNOSIS — M7989 Other specified soft tissue disorders: Secondary | ICD-10-CM | POA: Diagnosis not present

## 2023-04-22 DIAGNOSIS — G4733 Obstructive sleep apnea (adult) (pediatric): Secondary | ICD-10-CM | POA: Diagnosis not present

## 2023-04-22 DIAGNOSIS — F02A0 Dementia in other diseases classified elsewhere, mild, without behavioral disturbance, psychotic disturbance, mood disturbance, and anxiety: Secondary | ICD-10-CM | POA: Diagnosis not present

## 2023-04-22 DIAGNOSIS — G309 Alzheimer's disease, unspecified: Secondary | ICD-10-CM | POA: Diagnosis not present

## 2023-04-22 DIAGNOSIS — Z0001 Encounter for general adult medical examination with abnormal findings: Secondary | ICD-10-CM

## 2023-04-22 DIAGNOSIS — E559 Vitamin D deficiency, unspecified: Secondary | ICD-10-CM | POA: Diagnosis not present

## 2023-04-22 DIAGNOSIS — E782 Mixed hyperlipidemia: Secondary | ICD-10-CM

## 2023-04-22 DIAGNOSIS — I10 Essential (primary) hypertension: Secondary | ICD-10-CM | POA: Diagnosis not present

## 2023-04-22 MED ORDER — SILDENAFIL CITRATE 25 MG PO TABS: 25.0000 mg | ORAL_TABLET | Freq: Every day | ORAL | 1 refills | Status: DC | PRN

## 2023-04-22 NOTE — Assessment & Plan Note (Signed)
Uses CPAP device at nighttime Had dental appliance, but did not tolerate it 

## 2023-04-22 NOTE — Assessment & Plan Note (Signed)
On statin Will check lipid profile 

## 2023-04-22 NOTE — Assessment & Plan Note (Signed)
Physical exam as documented. Fasting blood tests today. Shingrix and Tdap vaccines at local pharmacy.

## 2023-04-22 NOTE — Assessment & Plan Note (Signed)
Likely due to chronic venous insufficiency Advised leg elevation and compression socks Continue to follow low-salt diet

## 2023-04-22 NOTE — Assessment & Plan Note (Signed)
Sildenafil PRN 

## 2023-04-22 NOTE — Progress Notes (Signed)
Established Patient Office Visit  Subjective:  Patient ID: Chris Taylor, male    DOB: 1941-05-15  Age: 82 y.o. MRN: [id number]  CC:  Chief Complaint  Patient presents with   Annual Exam    HPI Chris Taylor is a 82 y.o. male with past medical history of HTN, OSA on CPAP, HLD and mild cognitive impairment who presents for annual physical.  BP is well-controlled. Takes medications regularly. Patient denies headache, dizziness, chest pain, dyspnea or palpitations.  He takes Namenda for mild cognitive impairment.  He is independent for ADLs currently.  Denies any agitation or anhedonia currently. His MoCA was 24/30 today.  He reports erectile dysfunction.  Denies any dysuria, hematuria or urethral discharge currently.  He has tried sildenafil in the past.    Past Medical History:  Diagnosis Date   Arthritis    Family history of heart disease    Forgetfulness    Hemorrhoids    History of nuclear stress test 1996   negative   Sleep apnea     Past Surgical History:  Procedure Laterality Date   CATARACT EXTRACTION, BILATERAL     HEMORRHOID SURGERY N/A 05/17/2013   Procedure: HEMORRHOIDECTOMY;  Surgeon: Fabio Bering, MD;  Location: [facility];  Service: General;  Laterality: N/A;   INGUINAL HERNIA REPAIR Left 02/05/2017   Procedure: REPAIR LEFT INGUINAL HERNIA;  Surgeon: Violeta Gelinas, MD;  Location: [facility];  Service: General;  Laterality: Left;   INSERTION OF MESH Left 02/05/2017   Procedure: INSERTION OF MESH;  Surgeon: Violeta Gelinas, MD;  Location: [facility];  Service: General;  Laterality: Left;   KNEE ARTHROSCOPY Right    KNEE ARTHROSCOPY Left     Family History  Problem Relation Age of Onset   Heart disease Mother        MI @ 82, stroke   Stroke Mother    Heart disease Father        MI @ 66, CABG   Heart attack Brother    Heart attack Brother     Social History   Socioeconomic History   Marital status: Divorced    Spouse name: Not on file   Number of  children: 2   Years of education: Not on file   Highest education level: Not on file  Occupational History   Occupation: retired principal     Associate Professor: RETIRED    Comment: [employer]  Tobacco Use   Smoking status: Former    Years: 6    Types: Cigarettes    Quit date: 10/20/1983    Years since quitting: 39.5   Smokeless tobacco: Never  Vaping Use   Vaping Use: Never used  Substance and Sexual Activity   Alcohol use: Yes    Alcohol/week: 1.0 standard drink of alcohol    Types: 1 Glasses of wine per week    Comment: drinks 1/2 glass of wine with dinner   Drug use: No   Sexual activity: Not Currently  Other Topics Concern   Not on file  Social History Narrative   Lives in [place], [place].   Live alone.    Divorced. 2 children, Live in [place].   Not dating anyone.   Eats all food groups.   Wears seatbelt.    Social Determinants of Health   Financial Resource Strain: Low Risk  (07/12/2022)   Overall Financial Resource Strain (CARDIA)    Difficulty of Paying Living Expenses: Not hard at all  Food Insecurity: No Food  Insecurity (07/12/2022)   Hunger Vital Sign    Worried About Running Out of Food in the Last Year: Never true    Ran Out of Food in the Last Year: Never true  Transportation Needs: No Transportation Needs (07/12/2022)   PRAPARE - Administrator, Civil Service (Medical): No    Lack of Transportation (Non-Medical): No  Physical Activity: Sufficiently Active (07/12/2022)   Exercise Vital Sign    Days of Exercise per Week: 5 days    Minutes of Exercise per Session: 50 min  Stress: No Stress Concern Present (07/12/2022)   [employer] of Occupational Health - Occupational Stress Questionnaire    Feeling of Stress : Not at all  Social Connections: Moderately Integrated (07/12/2022)   Social Connection and Isolation Panel [NHANES]    Frequency of Communication with Friends and Family: More than three times a week    Frequency of Social  Gatherings with Friends and Family: Twice a week    Attends Religious Services: More than 4 times per year    Active Member of [employer] or Organizations: Yes    Attends Banker Meetings: 1 to 4 times per year    Marital Status: Divorced  Catering manager Violence: Not At Risk (07/12/2022)   Humiliation, Afraid, Rape, and Kick questionnaire    Fear of Current or Ex-Partner: No    Emotionally Abused: No    Physically Abused: No    Sexually Abused: No    Outpatient Medications Prior to Visit  Medication Sig Dispense Refill   albuterol (VENTOLIN HFA) 108 (90 Base) MCG/ACT inhaler Inhale 2 puffs into the lungs every 6 (six) hours as needed for wheezing or shortness of breath. 8 g 0   amLODipine (NORVASC) 5 MG tablet TAKE 1 TABLET BY MOUTH DAILY 100 tablet 2   aspirin EC 81 MG tablet Take 81 mg by mouth daily.     Cod Liver Oil OIL Take 15 mLs by mouth daily.     Coenzyme Q10 (COQ10) 200 MG CAPS Take 200 mg by mouth 3 (three) times a week.     fluticasone (FLONASE) 50 MCG/ACT nasal spray Place 2 sprays into both nostrils daily. 16 g 6   Garlic 1000 MG CAPS Take 1,000 mg by mouth 3 (three) times a week.     Ginkgo 60 MG TABS Take 60 mg by mouth 3 (three) times a week.      hydrOXYzine (ATARAX/VISTARIL) 10 MG tablet Take 1 tablet (10 mg total) by mouth 3 (three) times daily as needed for itching. 30 tablet 0   loratadine (CLARITIN) 10 MG tablet Take 1 tablet (10 mg total) by mouth daily. 90 tablet 3   memantine (NAMENDA) 10 MG tablet TAKE 1 TABLET BY MOUTH DAILY 100 tablet 2   Multiple Vitamin (MULTIVITAMIN WITH MINERALS) TABS tablet Take 1 tablet by mouth daily.     simvastatin (ZOCOR) 20 MG tablet TAKE 1 TABLET BY MOUTH AT  BEDTIME 100 tablet 2   zinc gluconate 50 MG tablet Take 50 mg by mouth 3 (three) times a week.     benzonatate (TESSALON) 200 MG capsule Take 1 capsule (200 mg total) by mouth 3 (three) times daily as needed for cough. 30 capsule 0   oseltamivir (TAMIFLU) 75 MG  capsule Take 1 capsule (75 mg total) by mouth every 12 (twelve) hours. 10 capsule 0   No facility-administered medications prior to visit.    Allergies  Allergen Reactions   No Known Allergies  ROS Review of Systems  Constitutional:  Negative for chills and fever.  HENT:  Negative for congestion and sore throat.   Eyes:  Negative for pain and discharge.  Respiratory:  Negative for cough and shortness of breath.   Cardiovascular:  Negative for chest pain and palpitations.  Gastrointestinal:  Negative for constipation, diarrhea, nausea and vomiting.  Endocrine: Negative for polydipsia and polyuria.  Genitourinary:  Negative for dysuria and hematuria.  Musculoskeletal:  Positive for arthralgias (knee) and neck pain. Negative for neck stiffness.  Skin:  Negative for rash.  Neurological:  Negative for dizziness, weakness, numbness and headaches.  Psychiatric/Behavioral:  Negative for agitation and behavioral problems.       Objective:    Physical Exam Vitals reviewed.  Constitutional:      General: He is not in acute distress.    Appearance: He is not diaphoretic.  HENT:     Head: Normocephalic and atraumatic.     Nose: Nose normal.     Mouth/Throat:     Mouth: Mucous membranes are moist.  Eyes:     General: No scleral icterus.    Extraocular Movements: Extraocular movements intact.  Neck:     Vascular: No carotid bruit.  Cardiovascular:     Rate and Rhythm: Normal rate and regular rhythm.     Pulses: Normal pulses.     Heart sounds: Normal heart sounds. No murmur heard. Pulmonary:     Breath sounds: Normal breath sounds. No wheezing or rales.  Abdominal:     Palpations: Abdomen is soft.     Tenderness: There is no abdominal tenderness.  Musculoskeletal:        General: Swelling (B/l knee, PIP and DIP joints of b/l hands) present.     Cervical back: Neck supple. No tenderness.     Right lower leg: Edema (1+) present.     Left lower leg: Edema (1+) present.      Comments: Heberden and Bouchard's nodes seen on b/l hands  Skin:    General: Skin is warm.     Findings: No rash.  Neurological:     General: No focal deficit present.     Mental Status: He is alert and oriented to person, place, and time.     Cranial Nerves: No cranial nerve deficit.     Sensory: No sensory deficit.     Motor: No weakness.  Psychiatric:        Mood and Affect: Mood normal.        Behavior: Behavior normal.     BP 130/72 (BP Location: Left Arm, Patient Position: Sitting, Cuff Size: Normal)   Pulse 73   Ht 5\' 9"  (1.753 m)   Wt 170 lb 3.2 oz (77.2 kg)   SpO2 97%   BMI 25.13 kg/m  Wt Readings from Last 3 Encounters:  04/22/23 170 lb 3.2 oz (77.2 kg)  10/18/22 170 lb 9.6 oz (77.4 kg)  04/16/22 175 lb (79.4 kg)    Lab Results  Component Value Date   TSH 2.930 04/16/2022   Lab Results  Component Value Date   WBC 4.2 04/16/2022   HGB 15.1 04/16/2022   HCT 46.2 04/16/2022   MCV 97 04/16/2022   PLT 194 04/16/2022   Lab Results  Component Value Date   NA 141 10/18/2022   K 4.1 10/18/2022   CO2 23 10/18/2022   GLUCOSE 100 (H) 10/18/2022   BUN 10 10/18/2022   CREATININE 1.06 10/18/2022   BILITOT 0.5 04/16/2022  ALKPHOS 66 04/16/2022   AST 37 04/16/2022   ALT 29 04/16/2022   PROT 7.2 04/16/2022   ALBUMIN 4.6 04/16/2022   CALCIUM 9.5 10/18/2022   EGFR 71 10/18/2022   Lab Results  Component Value Date   CHOL 188 10/18/2022   Lab Results  Component Value Date   HDL 96 10/18/2022   Lab Results  Component Value Date   LDLCALC 83 10/18/2022   Lab Results  Component Value Date   TRIG 46 10/18/2022   Lab Results  Component Value Date   CHOLHDL 2.0 10/18/2022   Lab Results  Component Value Date   HGBA1C 5.6 04/16/2022      Assessment & Plan:   Problem List Items Addressed This Visit       Cardiovascular and Mediastinum   Essential hypertension, benign    BP Readings from Last 1 Encounters:  04/22/23 130/72  Well-controlled with  Amlodipine 5 mg QD - considering his age Counseled for compliance with the medications Advised DASH diet and moderate exercise/walking, at least 150 mins/week      Relevant Medications   sildenafil (VIAGRA) 25 MG tablet   Other Relevant Orders   CMP14+EGFR   CBC with Differential/Platelet     Respiratory   OSA (obstructive sleep apnea)    Uses CPAP device at nighttime Had dental appliance, but did not tolerate it        Nervous and Auditory   Dementia (HCC)    On Namenda MoCA: 24/30 Independent for ADLs and IADLs.      Relevant Orders   TSH   CMP14+EGFR   CBC with Differential/Platelet     Other   Mixed hyperlipidemia (Chronic)    On statin Will check lipid profile      Relevant Medications   sildenafil (VIAGRA) 25 MG tablet   Other Relevant Orders   Lipid panel   ED (erectile dysfunction)    Sildenafil PRN      Relevant Medications   sildenafil (VIAGRA) 25 MG tablet   Encounter for general adult medical examination with abnormal findings - Primary    Physical exam as documented. Fasting blood tests today. Shingrix and Tdap vaccines at local pharmacy.      Leg swelling    Likely due to chronic venous insufficiency Advised leg elevation and compression socks Continue to follow low-salt diet      Other Visit Diagnoses     Vitamin D deficiency       Relevant Orders   VITAMIN D 25 Hydroxy (Vit-D Deficiency, Fractures)   Hyperglycemia       Relevant Orders   Hemoglobin A1c       Meds ordered this encounter  Medications   sildenafil (VIAGRA) 25 MG tablet    Sig: Take 1 tablet (25 mg total) by mouth daily as needed for erectile dysfunction.    Dispense:  10 tablet    Refill:  1    Follow-up: Return in about 6 months (around 10/23/2023) for HTN and OSA.    Anabel Halon, MD

## 2023-04-22 NOTE — Patient Instructions (Signed)
Please continue to take medications as prescribed. ? ?Please continue to follow low salt diet and ambulate as tolerated. ?

## 2023-04-22 NOTE — Assessment & Plan Note (Signed)
BP Readings from Last 1 Encounters:  04/22/23 130/72   Well-controlled with Amlodipine 5 mg QD - considering his age Counseled for compliance with the medications Advised DASH diet and moderate exercise/walking, at least 150 mins/week

## 2023-04-22 NOTE — Assessment & Plan Note (Addendum)
On Namenda MoCA: 24/30 Independent for ADLs and IADLs.

## 2023-04-23 LAB — CMP14+EGFR
ALT: 33 IU/L (ref 0–44)
AST: 45 IU/L — ABNORMAL HIGH (ref 0–40)
Albumin/Globulin Ratio: 1.6 (ref 1.2–2.2)
Albumin: 4.4 g/dL (ref 3.7–4.7)
Alkaline Phosphatase: 67 IU/L (ref 44–121)
BUN/Creatinine Ratio: 11 (ref 10–24)
BUN: 12 mg/dL (ref 8–27)
Bilirubin Total: 0.5 mg/dL (ref 0.0–1.2)
CO2: 20 mmol/L (ref 20–29)
Calcium: 9.3 mg/dL (ref 8.6–10.2)
Chloride: 103 mmol/L (ref 96–106)
Creatinine, Ser: 1.05 mg/dL (ref 0.76–1.27)
Globulin, Total: 2.7 g/dL (ref 1.5–4.5)
Glucose: 87 mg/dL (ref 70–99)
Potassium: 4.1 mmol/L (ref 3.5–5.2)
Sodium: 141 mmol/L (ref 134–144)
Total Protein: 7.1 g/dL (ref 6.0–8.5)
eGFR: 71 mL/min/{1.73_m2} (ref >59)

## 2023-04-23 LAB — CBC WITH DIFFERENTIAL/PLATELET
Basophils Absolute: 0.1 x10E3/uL (ref 0.0–0.2)
Basos: 1 %
EOS (ABSOLUTE): 0.4 x10E3/uL (ref 0.0–0.4)
Eos: 10 %
Hematocrit: 43.5 % (ref 37.5–51.0)
Hemoglobin: 14.2 g/dL (ref 13.0–17.7)
Immature Grans (Abs): 0 x10E3/uL (ref 0.0–0.1)
Immature Granulocytes: 0 %
Lymphocytes Absolute: 1.7 x10E3/uL (ref 0.7–3.1)
Lymphs: 38 %
MCH: 32.1 pg (ref 26.6–33.0)
MCHC: 32.6 g/dL (ref 31.5–35.7)
MCV: 98 fL — ABNORMAL HIGH (ref 79–97)
Monocytes Absolute: 0.4 x10E3/uL (ref 0.1–0.9)
Monocytes: 10 %
Neutrophils Absolute: 1.8 x10E3/uL (ref 1.4–7.0)
Neutrophils: 41 %
Platelets: 163 x10E3/uL (ref 150–450)
RBC: 4.42 x10E6/uL (ref 4.14–5.80)
RDW: 12.8 % (ref 11.6–15.4)
WBC: 4.4 x10E3/uL (ref 3.4–10.8)

## 2023-04-23 LAB — LIPID PANEL
Chol/HDL Ratio: 2.1 ratio (ref 0.0–5.0)
Cholesterol, Total: 180 mg/dL (ref 100–199)
HDL: 87 mg/dL
LDL Chol Calc (NIH): 83 mg/dL (ref 0–99)
Triglycerides: 48 mg/dL (ref 0–149)
VLDL Cholesterol Cal: 10 mg/dL (ref 5–40)

## 2023-04-23 LAB — VITAMIN D 25 HYDROXY (VIT D DEFICIENCY, FRACTURES): Vit D, 25-Hydroxy: 56.5 ng/mL (ref 30.0–100.0)

## 2023-04-23 LAB — TSH: TSH: 2.36 u[IU]/mL (ref 0.450–4.500)

## 2023-04-23 LAB — HEMOGLOBIN A1C
Est. average glucose Bld gHb Est-mCnc: 111 mg/dL
Hgb A1c MFr Bld: 5.5 % (ref 4.8–5.6)

## 2023-05-19 ENCOUNTER — Other Ambulatory Visit: Payer: Self-pay | Admitting: Internal Medicine

## 2023-06-04 DIAGNOSIS — G4733 Obstructive sleep apnea (adult) (pediatric): Secondary | ICD-10-CM | POA: Diagnosis not present

## 2023-07-16 DIAGNOSIS — M9902 Segmental and somatic dysfunction of thoracic region: Secondary | ICD-10-CM | POA: Diagnosis not present

## 2023-07-16 DIAGNOSIS — E782 Mixed hyperlipidemia: Secondary | ICD-10-CM | POA: Diagnosis not present

## 2023-07-16 DIAGNOSIS — M546 Pain in thoracic spine: Secondary | ICD-10-CM | POA: Diagnosis not present

## 2023-07-16 DIAGNOSIS — M9901 Segmental and somatic dysfunction of cervical region: Secondary | ICD-10-CM | POA: Diagnosis not present

## 2023-07-16 DIAGNOSIS — I10 Essential (primary) hypertension: Secondary | ICD-10-CM | POA: Diagnosis not present

## 2023-07-16 DIAGNOSIS — M9903 Segmental and somatic dysfunction of lumbar region: Secondary | ICD-10-CM | POA: Diagnosis not present

## 2023-07-16 DIAGNOSIS — M542 Cervicalgia: Secondary | ICD-10-CM | POA: Diagnosis not present

## 2023-07-21 DIAGNOSIS — M9901 Segmental and somatic dysfunction of cervical region: Secondary | ICD-10-CM | POA: Diagnosis not present

## 2023-07-21 DIAGNOSIS — M9903 Segmental and somatic dysfunction of lumbar region: Secondary | ICD-10-CM | POA: Diagnosis not present

## 2023-07-21 DIAGNOSIS — M542 Cervicalgia: Secondary | ICD-10-CM | POA: Diagnosis not present

## 2023-07-21 DIAGNOSIS — M546 Pain in thoracic spine: Secondary | ICD-10-CM | POA: Diagnosis not present

## 2023-07-21 DIAGNOSIS — M9902 Segmental and somatic dysfunction of thoracic region: Secondary | ICD-10-CM | POA: Diagnosis not present

## 2023-07-28 DIAGNOSIS — M9903 Segmental and somatic dysfunction of lumbar region: Secondary | ICD-10-CM | POA: Diagnosis not present

## 2023-07-28 DIAGNOSIS — M9902 Segmental and somatic dysfunction of thoracic region: Secondary | ICD-10-CM | POA: Diagnosis not present

## 2023-07-28 DIAGNOSIS — M546 Pain in thoracic spine: Secondary | ICD-10-CM | POA: Diagnosis not present

## 2023-07-28 DIAGNOSIS — M9901 Segmental and somatic dysfunction of cervical region: Secondary | ICD-10-CM | POA: Diagnosis not present

## 2023-07-28 DIAGNOSIS — M542 Cervicalgia: Secondary | ICD-10-CM | POA: Diagnosis not present

## 2023-08-08 DIAGNOSIS — M9903 Segmental and somatic dysfunction of lumbar region: Secondary | ICD-10-CM | POA: Diagnosis not present

## 2023-08-08 DIAGNOSIS — M542 Cervicalgia: Secondary | ICD-10-CM | POA: Diagnosis not present

## 2023-08-08 DIAGNOSIS — M546 Pain in thoracic spine: Secondary | ICD-10-CM | POA: Diagnosis not present

## 2023-08-08 DIAGNOSIS — M9902 Segmental and somatic dysfunction of thoracic region: Secondary | ICD-10-CM | POA: Diagnosis not present

## 2023-08-08 DIAGNOSIS — M9901 Segmental and somatic dysfunction of cervical region: Secondary | ICD-10-CM | POA: Diagnosis not present

## 2023-09-03 ENCOUNTER — Other Ambulatory Visit: Payer: Self-pay | Admitting: Internal Medicine

## 2023-09-11 DIAGNOSIS — M542 Cervicalgia: Secondary | ICD-10-CM | POA: Diagnosis not present

## 2023-09-11 DIAGNOSIS — M546 Pain in thoracic spine: Secondary | ICD-10-CM | POA: Diagnosis not present

## 2023-09-11 DIAGNOSIS — M9903 Segmental and somatic dysfunction of lumbar region: Secondary | ICD-10-CM | POA: Diagnosis not present

## 2023-09-11 DIAGNOSIS — M9902 Segmental and somatic dysfunction of thoracic region: Secondary | ICD-10-CM | POA: Diagnosis not present

## 2023-09-11 DIAGNOSIS — M9901 Segmental and somatic dysfunction of cervical region: Secondary | ICD-10-CM | POA: Diagnosis not present

## 2023-10-10 DIAGNOSIS — M9903 Segmental and somatic dysfunction of lumbar region: Secondary | ICD-10-CM | POA: Diagnosis not present

## 2023-10-10 DIAGNOSIS — M9901 Segmental and somatic dysfunction of cervical region: Secondary | ICD-10-CM | POA: Diagnosis not present

## 2023-10-10 DIAGNOSIS — M9902 Segmental and somatic dysfunction of thoracic region: Secondary | ICD-10-CM | POA: Diagnosis not present

## 2023-10-10 DIAGNOSIS — M542 Cervicalgia: Secondary | ICD-10-CM | POA: Diagnosis not present

## 2023-10-10 DIAGNOSIS — M546 Pain in thoracic spine: Secondary | ICD-10-CM | POA: Diagnosis not present

## 2023-10-28 ENCOUNTER — Encounter: Payer: Self-pay | Admitting: Internal Medicine

## 2023-10-28 ENCOUNTER — Ambulatory Visit (INDEPENDENT_AMBULATORY_CARE_PROVIDER_SITE_OTHER): Payer: Medicare Other | Admitting: Internal Medicine

## 2023-10-28 VITALS — BP 138/68 | HR 76 | Ht 69.0 in | Wt 171.0 lb

## 2023-10-28 DIAGNOSIS — M79671 Pain in right foot: Secondary | ICD-10-CM | POA: Insufficient documentation

## 2023-10-28 DIAGNOSIS — G4733 Obstructive sleep apnea (adult) (pediatric): Secondary | ICD-10-CM | POA: Diagnosis not present

## 2023-10-28 DIAGNOSIS — E782 Mixed hyperlipidemia: Secondary | ICD-10-CM | POA: Diagnosis not present

## 2023-10-28 DIAGNOSIS — M79672 Pain in left foot: Secondary | ICD-10-CM | POA: Diagnosis not present

## 2023-10-28 DIAGNOSIS — E559 Vitamin D deficiency, unspecified: Secondary | ICD-10-CM | POA: Diagnosis not present

## 2023-10-28 DIAGNOSIS — I10 Essential (primary) hypertension: Secondary | ICD-10-CM | POA: Diagnosis not present

## 2023-10-28 DIAGNOSIS — G309 Alzheimer's disease, unspecified: Secondary | ICD-10-CM | POA: Diagnosis not present

## 2023-10-28 DIAGNOSIS — R221 Localized swelling, mass and lump, neck: Secondary | ICD-10-CM | POA: Insufficient documentation

## 2023-10-28 DIAGNOSIS — F02A0 Dementia in other diseases classified elsewhere, mild, without behavioral disturbance, psychotic disturbance, mood disturbance, and anxiety: Secondary | ICD-10-CM

## 2023-10-28 NOTE — Assessment & Plan Note (Addendum)
BP Readings from Last 1 Encounters:  10/28/23 138/68   Well-controlled with Amlodipine 5 mg QD - considering his age Counseled for compliance with the medications Advised DASH diet and moderate exercise/walking, at least 150 mins/week

## 2023-10-28 NOTE — Assessment & Plan Note (Addendum)
On statin Checked lipid profile 

## 2023-10-28 NOTE — Assessment & Plan Note (Signed)
On Namenda MoCA: 24/30 Independent for ADLs and IADLs.

## 2023-10-28 NOTE — Patient Instructions (Signed)
Please continue to take medications as prescribed.  Please continue to follow low salt diet and perform moderate exercise/walking as tolerated.  Please get fasting blood tests done before the next visit.

## 2023-10-28 NOTE — Assessment & Plan Note (Signed)
Uses CPAP device at nighttime Had dental appliance, but did not tolerate it 

## 2023-10-28 NOTE — Assessment & Plan Note (Signed)
Has nontender mass in submandibular area Check [place] of neck to rule out LAD versus cyst

## 2023-10-28 NOTE — Progress Notes (Signed)
Established Patient Office Visit  Subjective:  Patient ID: Chris Taylor, male    DOB: 1941-10-02  Age: 82 y.o. MRN: [id number]  CC:  Chief Complaint  Patient presents with   Hypertension    Six month follow up    Sleep Apnea    Six month follow up    Tingling    Tingling in both heels     HPI Chris Taylor is a 82 y.o. male with past medical history of HTN, OSA on CPAP, HLD and mild cognitive impairment who presents for f/u of his chronic medical conditions.  BP is well-controlled. Takes medications regularly. Patient denies headache, dizziness, chest pain, dyspnea or palpitations.  He takes Namenda for mild cognitive impairment.  He is independent for ADLs currently.  Denies any agitation or anhedonia currently. His MoCA was 24/30 in the last visit.  He reports erectile dysfunction. He is currently taking OTC supplement for it, likely has nitric oxide in it.  Denies any dysuria, hematuria or urethral discharge currently.  He has tried sildenafil in the past.  He reports noticing bumps in the neck area, which are nontender and have resolved in the past.  He currently has 1 nontender mass in the left side of the neck in the submandibular area.  Denies any recent URTI.  Denies any fever, chills, recent appetite or weight change.  He also complains of bilateral heel skin thickness and tingling sensation since 07/28/23.  Denies any injury.  He has been scraping the skin with a filer.  Past Medical History:  Diagnosis Date   Arthritis    Family history of heart disease    Forgetfulness    Hemorrhoids    History of nuclear stress test 1996   negative   Sleep apnea     Past Surgical History:  Procedure Laterality Date   CATARACT EXTRACTION, BILATERAL     HEMORRHOID SURGERY N/A 05/17/2013   Procedure: HEMORRHOIDECTOMY;  Surgeon: Fabio Bering, MD;  Location: [facility];  Service: General;  Laterality: N/A;   INGUINAL HERNIA REPAIR Left 02/05/2017   Procedure: REPAIR LEFT  INGUINAL HERNIA;  Surgeon: Violeta Gelinas, MD;  Location: [facility];  Service: General;  Laterality: Left;   INSERTION OF MESH Left 02/05/2017   Procedure: INSERTION OF MESH;  Surgeon: Violeta Gelinas, MD;  Location: [facility];  Service: General;  Laterality: Left;   KNEE ARTHROSCOPY Right    KNEE ARTHROSCOPY Left     Family History  Problem Relation Age of Onset   Heart disease Mother        MI @ 63, stroke   Stroke Mother    Heart disease Father        MI @ 28, CABG   Heart attack Brother    Heart attack Brother     Social History   Socioeconomic History   Marital status: Divorced    Spouse name: Not on file   Number of children: 2   Years of education: Not on file   Highest education level: Not on file  Occupational History   Occupation: retired principal     Associate Professor: RETIRED    Comment: [employer]  Tobacco Use   Smoking status: Former    Current packs/day: 0.00    Types: Cigarettes    Start date: 10/19/1977    Quit date: 10/20/1983    Years since quitting: 40.0   Smokeless tobacco: Never  Vaping Use   Vaping status: Never Used  Substance and  Sexual Activity   Alcohol use: Yes    Alcohol/week: 1.0 standard drink of alcohol    Types: 1 Glasses of wine per week    Comment: drinks 1/2 glass of wine with dinner   Drug use: No   Sexual activity: Not Currently  Other Topics Concern   Not on file  Social History Narrative   Lives in [place], [place].   Live alone.    Divorced. 2 children, Live in [place].   Not dating anyone.   Eats all food groups.   Wears seatbelt.    Social Determinants of Health   Financial Resource Strain: Low Risk  (07/12/2022)   Overall Financial Resource Strain (CARDIA)    Difficulty of Paying Living Expenses: Not hard at all  Food Insecurity: No Food Insecurity (07/12/2022)   Hunger Vital Sign    Worried About Running Out of Food in the Last Year: Never true    Ran Out of Food in the Last Year: Never true  Transportation Needs: No  Transportation Needs (07/12/2022)   PRAPARE - Administrator, Civil Service (Medical): No    Lack of Transportation (Non-Medical): No  Physical Activity: Sufficiently Active (07/12/2022)   Exercise Vital Sign    Days of Exercise per Week: 5 days    Minutes of Exercise per Session: 50 min  Stress: No Stress Concern Present (07/12/2022)   [employer] of Occupational Health - Occupational Stress Questionnaire    Feeling of Stress : Not at all  Social Connections: Moderately Integrated (07/12/2022)   Social Connection and Isolation Panel [NHANES]    Frequency of Communication with Friends and Family: More than three times a week    Frequency of Social Gatherings with Friends and Family: Twice a week    Attends Religious Services: More than 4 times per year    Active Member of [employer] or Organizations: Yes    Attends Banker Meetings: 1 to 4 times per year    Marital Status: Divorced  Catering manager Violence: Not At Risk (07/12/2022)   Humiliation, Afraid, Rape, and Kick questionnaire    Fear of Current or Ex-Partner: No    Emotionally Abused: No    Physically Abused: No    Sexually Abused: No    Outpatient Medications Prior to Visit  Medication Sig Dispense Refill   albuterol (VENTOLIN HFA) 108 (90 Base) MCG/ACT inhaler Inhale 2 puffs into the lungs every 6 (six) hours as needed for wheezing or shortness of breath. 8 g 0   amLODipine (NORVASC) 5 MG tablet TAKE 1 TABLET BY MOUTH DAILY 100 tablet 2   aspirin EC 81 MG tablet Take 81 mg by mouth daily.     Cod Liver Oil OIL Take 15 mLs by mouth daily.     Coenzyme Q10 (COQ10) 200 MG CAPS Take 200 mg by mouth 3 (three) times a week.     fluticasone (FLONASE) 50 MCG/ACT nasal spray Place 2 sprays into both nostrils daily. 16 g 6   Garlic 1000 MG CAPS Take 1,000 mg by mouth 3 (three) times a week.     Ginkgo 60 MG TABS Take 60 mg by mouth 3 (three) times a week.      hydrOXYzine (ATARAX/VISTARIL) 10 MG tablet Take 1  tablet (10 mg total) by mouth 3 (three) times daily as needed for itching. 30 tablet 0   loratadine (CLARITIN) 10 MG tablet Take 1 tablet (10 mg total) by mouth daily. 90 tablet 3   memantine (  NAMENDA) 10 MG tablet TAKE 1 TABLET BY MOUTH DAILY 100 tablet 2   Multiple Vitamin (MULTIVITAMIN WITH MINERALS) TABS tablet Take 1 tablet by mouth daily.     sildenafil (VIAGRA) 25 MG tablet Take 1 tablet (25 mg total) by mouth daily as needed for erectile dysfunction. 10 tablet 1   simvastatin (ZOCOR) 20 MG tablet TAKE 1 TABLET BY MOUTH AT  BEDTIME 100 tablet 2   zinc gluconate 50 MG tablet Take 50 mg by mouth 3 (three) times a week.     No facility-administered medications prior to visit.    Allergies  Allergen Reactions   No Known Allergies     ROS Review of Systems  Constitutional:  Negative for chills and fever.  HENT:  Negative for congestion and sore throat.   Eyes:  Negative for pain and discharge.  Respiratory:  Negative for cough and shortness of breath.   Cardiovascular:  Negative for chest pain and palpitations.  Gastrointestinal:  Negative for constipation, diarrhea, nausea and vomiting.  Endocrine: Negative for polydipsia and polyuria.  Genitourinary:  Negative for dysuria and hematuria.  Musculoskeletal:  Positive for arthralgias (knee) and neck pain. Negative for neck stiffness.  Skin:  Negative for rash.  Neurological:  Negative for dizziness, weakness, numbness and headaches.  Psychiatric/Behavioral:  Negative for agitation and behavioral problems.       Objective:    Physical Exam Vitals reviewed.  Constitutional:      General: He is not in acute distress.    Appearance: He is not diaphoretic.  HENT:     Head: Normocephalic and atraumatic.     Nose: Nose normal.     Mouth/Throat:     Mouth: Mucous membranes are moist.  Eyes:     General: No scleral icterus.    Extraocular Movements: Extraocular movements intact.  Neck:     Vascular: No carotid bruit.      Comments: Left sided submandibular mass, about 1.5-2 cm in diameter, nontender Cardiovascular:     Rate and Rhythm: Normal rate and regular rhythm.     Pulses: Normal pulses.     Heart sounds: Normal heart sounds. No murmur heard. Pulmonary:     Breath sounds: Normal breath sounds. No wheezing or rales.  Musculoskeletal:        General: Swelling (B/l knee, PIP and DIP joints of b/l hands) present.     Cervical back: Neck supple. No tenderness.     Right lower leg: Edema (1+) present.     Left lower leg: Edema (1+) present.     Comments: Heberden and Bouchard's nodes seen on b/l hands  Feet:     Comments: Bilateral heel skin thickness Skin:    General: Skin is warm.     Findings: No rash.  Neurological:     General: No focal deficit present.     Mental Status: He is alert and oriented to person, place, and time.     Sensory: No sensory deficit.     Motor: No weakness.  Psychiatric:        Mood and Affect: Mood normal.        Behavior: Behavior normal.     BP 138/68 (BP Location: Left Arm)   Pulse 76   Ht 5\' 9"  (1.753 m)   Wt 171 lb (77.6 kg)   SpO2 94%   BMI 25.25 kg/m  Wt Readings from Last 3 Encounters:  10/28/23 171 lb (77.6 kg)  04/22/23 170 lb 3.2 oz (77.2 kg)  10/18/22 170 lb 9.6 oz (77.4 kg)    Lab Results  Component Value Date   TSH 2.360 04/22/2023   Lab Results  Component Value Date   WBC 4.4 04/22/2023   HGB 14.2 04/22/2023   HCT 43.5 04/22/2023   MCV 98 (H) 04/22/2023   PLT 163 04/22/2023   Lab Results  Component Value Date   NA 140 07/16/2023   K 3.9 07/16/2023   CO2 21 07/16/2023   GLUCOSE 86 07/16/2023   BUN 12 07/16/2023   CREATININE 1.03 07/16/2023   BILITOT 0.7 07/16/2023   ALKPHOS 66 07/16/2023   AST 38 07/16/2023   ALT 32 07/16/2023   PROT 6.8 07/16/2023   ALBUMIN 4.5 07/16/2023   CALCIUM 9.4 07/16/2023   EGFR 73 07/16/2023   Lab Results  Component Value Date   CHOL 180 07/16/2023   Lab Results  Component Value Date   HDL  82 07/16/2023   Lab Results  Component Value Date   LDLCALC 87 07/16/2023   Lab Results  Component Value Date   TRIG 54 07/16/2023   Lab Results  Component Value Date   CHOLHDL 2.2 07/16/2023   Lab Results  Component Value Date   HGBA1C 5.5 04/22/2023      Assessment & Plan:   Problem List Items Addressed This Visit       Cardiovascular and Mediastinum   Essential hypertension, benign - Primary    BP Readings from Last 1 Encounters:  10/28/23 138/68   Well-controlled with Amlodipine 5 mg QD - considering his age Counseled for compliance with the medications Advised DASH diet and moderate exercise/walking, at least 150 mins/week      Relevant Orders   TSH   CMP14+EGFR   CBC with Differential/Platelet     Respiratory   OSA (obstructive sleep apnea)    Uses CPAP device at nighttime Had dental appliance, but did not tolerate it        Nervous and Auditory   Dementia (HCC)    On Namenda MoCA: 24/30 Independent for ADLs and IADLs.      Relevant Orders   TSH     Other   Mixed hyperlipidemia (Chronic)    On statin Checked lipid profile      Relevant Orders   Lipid panel   Heel pain, bilateral    Likely due to inadequate he will support Referred to local podiatry       Relevant Orders   Ambulatory referral to Podiatry   Neck mass    Has nontender mass in submandibular area Check [place] of neck to rule out LAD versus cyst      Relevant Orders   US Soft Tissue Head/Neck (NON-THYROID)   Other Visit Diagnoses     Vitamin D deficiency       Relevant Orders   VITAMIN D 25 Hydroxy (Vit-D Deficiency, Fractures)        No orders of the defined types were placed in this encounter.   Follow-up: Return in about 6 months (around 04/26/2024) for Annual physical.    Anabel Halon, MD

## 2023-10-28 NOTE — Assessment & Plan Note (Signed)
Likely due to inadequate he will support Referred to local podiatry

## 2023-10-31 ENCOUNTER — Other Ambulatory Visit: Payer: Self-pay | Admitting: Internal Medicine

## 2023-10-31 DIAGNOSIS — E782 Mixed hyperlipidemia: Secondary | ICD-10-CM

## 2023-11-04 ENCOUNTER — Ambulatory Visit (HOSPITAL_COMMUNITY): Payer: Medicare Other | Attending: Internal Medicine

## 2023-11-07 DIAGNOSIS — M9901 Segmental and somatic dysfunction of cervical region: Secondary | ICD-10-CM | POA: Diagnosis not present

## 2023-11-07 DIAGNOSIS — M9902 Segmental and somatic dysfunction of thoracic region: Secondary | ICD-10-CM | POA: Diagnosis not present

## 2023-11-07 DIAGNOSIS — M542 Cervicalgia: Secondary | ICD-10-CM | POA: Diagnosis not present

## 2023-11-07 DIAGNOSIS — M9903 Segmental and somatic dysfunction of lumbar region: Secondary | ICD-10-CM | POA: Diagnosis not present

## 2023-11-07 DIAGNOSIS — M546 Pain in thoracic spine: Secondary | ICD-10-CM | POA: Diagnosis not present

## 2023-12-05 DIAGNOSIS — M9903 Segmental and somatic dysfunction of lumbar region: Secondary | ICD-10-CM | POA: Diagnosis not present

## 2023-12-05 DIAGNOSIS — M9902 Segmental and somatic dysfunction of thoracic region: Secondary | ICD-10-CM | POA: Diagnosis not present

## 2023-12-05 DIAGNOSIS — M9901 Segmental and somatic dysfunction of cervical region: Secondary | ICD-10-CM | POA: Diagnosis not present

## 2023-12-05 DIAGNOSIS — M542 Cervicalgia: Secondary | ICD-10-CM | POA: Diagnosis not present

## 2023-12-05 DIAGNOSIS — M546 Pain in thoracic spine: Secondary | ICD-10-CM | POA: Diagnosis not present

## 2023-12-20 ENCOUNTER — Encounter: Payer: Self-pay | Admitting: Internal Medicine

## 2024-01-28 ENCOUNTER — Other Ambulatory Visit: Payer: Self-pay | Admitting: Internal Medicine

## 2024-04-07 ENCOUNTER — Other Ambulatory Visit: Payer: Self-pay | Admitting: Internal Medicine

## 2024-04-07 DIAGNOSIS — G4733 Obstructive sleep apnea (adult) (pediatric): Secondary | ICD-10-CM

## 2024-04-21 ENCOUNTER — Telehealth: Payer: Self-pay

## 2024-04-21 NOTE — Telephone Encounter (Signed)
 Copied from CRM [id number]. Topic: Clinical - Request for Lab/Test Order >> Apr 21, 2024  2:18 PM Elle L wrote: Reason for CRM: The patient is requesting lab orders before his annual physical on 5/13.

## 2024-04-21 NOTE — Telephone Encounter (Signed)
 Copied from CRM [id number]. Topic: Clinical - Order For Equipment >> Apr 21, 2024  2:15 PM Elle L wrote: Reason for CRM: The patient is requesting a prescription for his CPAP supplies to go to [facility]. The patient's call back number is [phone].

## 2024-04-21 NOTE — Telephone Encounter (Signed)
 Spoke to pt states he would rather wait until the day of the appointment to have the labs done. Just an FYI

## 2024-04-22 NOTE — Telephone Encounter (Signed)
Will be on the look out for form. °

## 2024-04-22 NOTE — Telephone Encounter (Signed)
 Copied from CRM [id number]. Topic: General - Other >> Apr 21, 2024  5:06 PM Ethelle Herb L wrote: Reason for CRM: Milissa Alley w/ [facility] states they received order for cpap for patient on 04/08/2024.   Order is signed but signed w/ date of 04/09/2023. Milissa Alley will fax over another order and requesting that be signed and faxed back w/ updated date.    Confirmed fax number w/ Milissa Alley.

## 2024-04-23 DIAGNOSIS — I10 Essential (primary) hypertension: Secondary | ICD-10-CM | POA: Diagnosis not present

## 2024-04-23 DIAGNOSIS — E782 Mixed hyperlipidemia: Secondary | ICD-10-CM | POA: Diagnosis not present

## 2024-04-23 DIAGNOSIS — E559 Vitamin D deficiency, unspecified: Secondary | ICD-10-CM | POA: Diagnosis not present

## 2024-04-23 DIAGNOSIS — G309 Alzheimer's disease, unspecified: Secondary | ICD-10-CM | POA: Diagnosis not present

## 2024-04-24 LAB — CBC WITH DIFFERENTIAL/PLATELET
Basophils Absolute: 0 10*3/uL (ref 0.0–0.2)
Basos: 1 %
EOS (ABSOLUTE): 0.4 10*3/uL (ref 0.0–0.4)
Eos: 8 %
Hematocrit: 42.5 % (ref 37.5–51.0)
Hemoglobin: 13.8 g/dL (ref 13.0–17.7)
Immature Grans (Abs): 0 10*3/uL (ref 0.0–0.1)
Immature Granulocytes: 0 %
Lymphocytes Absolute: 1.6 10*3/uL (ref 0.7–3.1)
Lymphs: 37 %
MCH: 32.2 pg (ref 26.6–33.0)
MCHC: 32.5 g/dL (ref 31.5–35.7)
MCV: 99 fL — ABNORMAL HIGH (ref 79–97)
Monocytes Absolute: 0.5 10*3/uL (ref 0.1–0.9)
Monocytes: 11 %
Neutrophils Absolute: 1.8 10*3/uL (ref 1.4–7.0)
Neutrophils: 43 %
Platelets: 187 10*3/uL (ref 150–450)
RBC: 4.29 x10E6/uL (ref 4.14–5.80)
RDW: 12.4 % (ref 11.6–15.4)
WBC: 4.3 10*3/uL (ref 3.4–10.8)

## 2024-04-24 LAB — CMP14+EGFR
ALT: 32 IU/L (ref 0–44)
AST: 38 IU/L (ref 0–40)
Albumin: 4.5 g/dL (ref 3.7–4.7)
Alkaline Phosphatase: 79 IU/L (ref 44–121)
BUN/Creatinine Ratio: 14 (ref 10–24)
BUN: 13 mg/dL (ref 8–27)
Bilirubin Total: 0.5 mg/dL (ref 0.0–1.2)
CO2: 22 mmol/L (ref 20–29)
Calcium: 9.4 mg/dL (ref 8.6–10.2)
Chloride: 105 mmol/L (ref 96–106)
Creatinine, Ser: 0.93 mg/dL (ref 0.76–1.27)
Globulin, Total: 2.6 g/dL (ref 1.5–4.5)
Glucose: 86 mg/dL (ref 70–99)
Potassium: 4.1 mmol/L (ref 3.5–5.2)
Sodium: 141 mmol/L (ref 134–144)
Total Protein: 7.1 g/dL (ref 6.0–8.5)
eGFR: 81 mL/min/{1.73_m2} (ref >59)

## 2024-04-24 LAB — LIPID PANEL
Chol/HDL Ratio: 2.2 ratio (ref 0.0–5.0)
Cholesterol, Total: 178 mg/dL (ref 100–199)
HDL: 82 mg/dL (ref >39)
LDL Chol Calc (NIH): 85 mg/dL (ref 0–99)
Triglycerides: 53 mg/dL (ref 0–149)
VLDL Cholesterol Cal: 11 mg/dL (ref 5–40)

## 2024-04-24 LAB — TSH: TSH: 2.44 u[IU]/mL (ref 0.450–4.500)

## 2024-04-24 LAB — VITAMIN D 25 HYDROXY (VIT D DEFICIENCY, FRACTURES): Vit D, 25-Hydroxy: 67.9 ng/mL (ref 30.0–100.0)

## 2024-04-27 ENCOUNTER — Telehealth: Payer: Medicare Other | Admitting: Internal Medicine

## 2024-05-08 ENCOUNTER — Other Ambulatory Visit: Payer: Self-pay | Admitting: Internal Medicine

## 2024-06-30 ENCOUNTER — Telehealth: Payer: Self-pay | Admitting: Internal Medicine

## 2024-06-30 NOTE — Telephone Encounter (Signed)
 Copied from CRM [id number]. Topic: General - Other >> Jun 30, 2024 10:19 AM Turkey B wrote: Reason for CRM: PT called in says had a  missed call, nothing shows on file, please cb if need be

## 2024-07-01 ENCOUNTER — Encounter: Payer: Self-pay | Admitting: Internal Medicine

## 2024-07-09 ENCOUNTER — Other Ambulatory Visit: Payer: Self-pay | Admitting: Internal Medicine

## 2024-07-09 DIAGNOSIS — E782 Mixed hyperlipidemia: Secondary | ICD-10-CM

## 2024-08-24 ENCOUNTER — Other Ambulatory Visit: Payer: Self-pay

## 2024-08-24 ENCOUNTER — Telehealth: Payer: Self-pay | Admitting: Internal Medicine

## 2024-08-24 DIAGNOSIS — Z23 Encounter for immunization: Secondary | ICD-10-CM

## 2024-08-24 MED ORDER — UNABLE TO FIND: 0 refills | Status: AC

## 2024-08-24 NOTE — Telephone Encounter (Signed)
 Patient here in office asking for a rx to go get his covid vaccine

## 2024-08-24 NOTE — Telephone Encounter (Signed)
Rx printed and given to patient.

## 2024-10-02 ENCOUNTER — Other Ambulatory Visit: Payer: Self-pay | Admitting: Internal Medicine

## 2024-10-10 ENCOUNTER — Other Ambulatory Visit: Payer: Self-pay | Admitting: Internal Medicine

## 2024-10-14 ENCOUNTER — Encounter: Payer: Self-pay | Admitting: Internal Medicine

## 2024-10-14 ENCOUNTER — Ambulatory Visit (INDEPENDENT_AMBULATORY_CARE_PROVIDER_SITE_OTHER): Admitting: Internal Medicine

## 2024-10-14 VITALS — BP 126/76 | HR 77 | Ht 69.0 in | Wt 166.4 lb

## 2024-10-14 DIAGNOSIS — R221 Localized swelling, mass and lump, neck: Secondary | ICD-10-CM

## 2024-10-14 DIAGNOSIS — G4733 Obstructive sleep apnea (adult) (pediatric): Secondary | ICD-10-CM | POA: Diagnosis not present

## 2024-10-14 DIAGNOSIS — E782 Mixed hyperlipidemia: Secondary | ICD-10-CM

## 2024-10-14 DIAGNOSIS — E559 Vitamin D deficiency, unspecified: Secondary | ICD-10-CM | POA: Diagnosis not present

## 2024-10-14 DIAGNOSIS — N529 Male erectile dysfunction, unspecified: Secondary | ICD-10-CM | POA: Diagnosis not present

## 2024-10-14 DIAGNOSIS — I10 Essential (primary) hypertension: Secondary | ICD-10-CM

## 2024-10-14 DIAGNOSIS — M79671 Pain in right foot: Secondary | ICD-10-CM | POA: Diagnosis not present

## 2024-10-14 DIAGNOSIS — G309 Alzheimer's disease, unspecified: Secondary | ICD-10-CM | POA: Diagnosis not present

## 2024-10-14 DIAGNOSIS — F02A0 Dementia in other diseases classified elsewhere, mild, without behavioral disturbance, psychotic disturbance, mood disturbance, and anxiety: Secondary | ICD-10-CM

## 2024-10-14 DIAGNOSIS — Z0001 Encounter for general adult medical examination with abnormal findings: Secondary | ICD-10-CM

## 2024-10-14 DIAGNOSIS — M79672 Pain in left foot: Secondary | ICD-10-CM

## 2024-10-14 MED ORDER — SILDENAFIL CITRATE 25 MG PO TABS: 25.0000 mg | ORAL_TABLET | Freq: Every day | ORAL | 1 refills | Status: AC | PRN

## 2024-10-14 NOTE — Assessment & Plan Note (Addendum)
 Physical exam as documented. Fasting blood tests today. Up to date with age-appropriate vaccinations.

## 2024-10-14 NOTE — Assessment & Plan Note (Signed)
 On Namenda  MoCA: 24/30 previously Independent for ADLs and IADLs.

## 2024-10-14 NOTE — Patient Instructions (Addendum)
 Schedule your Medicare Annual Wellness Visit at checkout.  Please continue to take medications as prescribed.  Please continue to follow low salt diet and ambulate as tolerated.  Please get US  of neck done as discussed.

## 2024-10-14 NOTE — Assessment & Plan Note (Signed)
 Likely due to inadequate heel support Referred to local podiatry in the past Has skin cracking of the heel, advised to apply lotion or moisturizer

## 2024-10-14 NOTE — Assessment & Plan Note (Signed)
 BP Readings from Last 1 Encounters:  10/14/24 126/76   Well-controlled with Amlodipine  5 mg QD - considering his age Counseled for compliance with the medications Advised DASH diet and moderate exercise/walking, at least 150 mins/week

## 2024-10-14 NOTE — Assessment & Plan Note (Signed)
Uses CPAP device at nighttime Had dental appliance, but did not tolerate it 

## 2024-10-14 NOTE — Assessment & Plan Note (Signed)
On statin Checked lipid profile 

## 2024-10-14 NOTE — Assessment & Plan Note (Signed)
 Has nontender mass in submandibular area, had reported it in 11/24 Check US  of neck to rule out LAD versus cyst -ordered again

## 2024-10-14 NOTE — Assessment & Plan Note (Addendum)
 Sildenafil  PRN, helps him with scrotal discomfort at times

## 2024-10-14 NOTE — Progress Notes (Signed)
 Established Patient Office Visit  Subjective:  Patient ID: KENNETH CUARESMA, male    DOB: 1941/05/10  Age: 83 y.o. MRN: [id number]  CC:  Chief Complaint  Patient presents with   Annual Exam    cpe   Neck mass    Lymph node swelling since August from seeing chiropractor.     HPI CARA AGUINO is a 83 y.o. male with past medical history of HTN, OSA on CPAP, HLD and mild cognitive impairment who presents for annual physical.  BP is well-controlled. Takes medications regularly. Patient denies headache, dizziness, chest pain, dyspnea or palpitations.  He takes Namenda  for mild cognitive impairment.  He is independent for ADLs currently.  Denies any agitation or anhedonia currently.  He reports erectile dysfunction. He is currently taking OTC supplement for it, likely has nitric oxide in it.  Denies any dysuria, hematuria or urethral discharge currently.  He has tried sildenafil  in the past.  He reports noticing bumps in the neck area, which are nontender and have resolved in the past.  He currently has 1 nontender mass in the left side of the neck in the submandibular area.  He reports noticing it since chiropractor visit 2 months ago, but he had reported similar mass in the last visit in 11/24, but has not had US  of neck yet that was ordered in the last visit. Denies any recent URTI.  Denies any fever, chills, recent appetite or weight change.  He also complains of bilateral heel skin thickness and tingling sensation.  Denies any injury.  He has been scraping the skin with a filer.  Past Medical History:  Diagnosis Date   Arthritis    Family history of heart disease    Forgetfulness    Hemorrhoids    History of nuclear stress test 1996   negative   Sleep apnea     Past Surgical History:  Procedure Laterality Date   CATARACT EXTRACTION, BILATERAL     HEMORRHOID SURGERY N/A 05/17/2013   Procedure: HEMORRHOIDECTOMY;  Surgeon: Thresa JAYSON Pulling, MD;  Location: [facility];  Service:  General;  Laterality: N/A;   INGUINAL HERNIA REPAIR Left 02/05/2017   Procedure: REPAIR LEFT INGUINAL HERNIA;  Surgeon: Dann Hummer, MD;  Location: [facility];  Service: General;  Laterality: Left;   INSERTION OF MESH Left 02/05/2017   Procedure: INSERTION OF MESH;  Surgeon: Dann Hummer, MD;  Location: [facility];  Service: General;  Laterality: Left;   KNEE ARTHROSCOPY Right    KNEE ARTHROSCOPY Left     Family History  Problem Relation Age of Onset   Heart disease Mother        MI @ 90, stroke   Stroke Mother    Heart disease Father        MI @ 1, CABG   Heart attack Brother    Heart attack Brother     Social History   Socioeconomic History   Marital status: Divorced    Spouse name: Not on file   Number of children: 2   Years of education: Not on file   Highest education level: Not on file  Occupational History   Occupation: retired principal     Associate Professor: RETIRED    Comment: [employer]  Tobacco Use   Smoking status: Former    Current packs/day: 0.00    Types: Cigarettes    Start date: 10/19/1977    Quit date: 10/20/1983    Years since quitting: 41.0   Smokeless tobacco:  Never  Vaping Use   Vaping status: Never Used  Substance and Sexual Activity   Alcohol use: Yes    Alcohol/week: 1.0 standard drink of alcohol    Types: 1 Glasses of wine per week    Comment: drinks 1/2 glass of wine with dinner   Drug use: No   Sexual activity: Not Currently  Other Topics Concern   Not on file  Social History Narrative   Lives in [place], [place].   Live alone.    Divorced. 2 children, Live in [place].   Not dating anyone.   Eats all food groups.   Wears seatbelt.    Social Drivers of Corporate Investment Banker Strain: Low Risk  (07/12/2022)   Overall Financial Resource Strain (CARDIA)    Difficulty of Paying Living Expenses: Not hard at all  Food Insecurity: No Food Insecurity (07/12/2022)   Hunger Vital Sign    Worried About Running Out of Food in the Last Year:  Never true    Ran Out of Food in the Last Year: Never true  Transportation Needs: No Transportation Needs (07/12/2022)   PRAPARE - Administrator, Civil Service (Medical): No    Lack of Transportation (Non-Medical): No  Physical Activity: Sufficiently Active (07/12/2022)   Exercise Vital Sign    Days of Exercise per Week: 5 days    Minutes of Exercise per Session: 50 min  Stress: No Stress Concern Present (07/12/2022)   [employer] of Occupational Health - Occupational Stress Questionnaire    Feeling of Stress : Not at all  Social Connections: Moderately Integrated (07/12/2022)   Social Connection and Isolation Panel    Frequency of Communication with Friends and Family: More than three times a week    Frequency of Social Gatherings with Friends and Family: Twice a week    Attends Religious Services: More than 4 times per year    Active Member of [employer] or Organizations: Yes    Attends Banker Meetings: 1 to 4 times per year    Marital Status: Divorced  Catering Manager Violence: Not At Risk (07/12/2022)   Humiliation, Afraid, Rape, and Kick questionnaire    Fear of Current or Ex-Partner: No    Emotionally Abused: No    Physically Abused: No    Sexually Abused: No    Outpatient Medications Prior to Visit  Medication Sig Dispense Refill   albuterol  (VENTOLIN  HFA) 108 (90 Base) MCG/ACT inhaler Inhale 2 puffs into the lungs every 6 (six) hours as needed for wheezing or shortness of breath. 8 g 0   amLODipine  (NORVASC ) 5 MG tablet TAKE 1 TABLET BY MOUTH DAILY 100 tablet 2   aspirin EC 81 MG tablet Take 81 mg by mouth daily.     Cod Liver Oil OIL Take 15 mLs by mouth daily.     Coenzyme Q10 (COQ10) 200 MG CAPS Take 200 mg by mouth 3 (three) times a week.     fluticasone  (FLONASE ) 50 MCG/ACT nasal spray Place 2 sprays into both nostrils daily. 16 g 6   Garlic 1000 MG CAPS Take 1,000 mg by mouth 3 (three) times a week.     Ginkgo 60 MG TABS Take 60 mg by mouth 3  (three) times a week.      hydrOXYzine  (ATARAX /VISTARIL ) 10 MG tablet Take 1 tablet (10 mg total) by mouth 3 (three) times daily as needed for itching. 30 tablet 0   loratadine  (CLARITIN ) 10 MG tablet Take 1 tablet (  10 mg total) by mouth daily. 90 tablet 3   memantine  (NAMENDA ) 10 MG tablet TAKE 1 TABLET BY MOUTH DAILY 100 tablet 2   Multiple Vitamin (MULTIVITAMIN WITH MINERALS) TABS tablet Take 1 tablet by mouth daily.     simvastatin  (ZOCOR ) 20 MG tablet TAKE 1 TABLET BY MOUTH AT  BEDTIME 100 tablet 2   UNABLE TO FIND Med Name: COVID VACCINE 2025 1 each 0   zinc gluconate 50 MG tablet Take 50 mg by mouth 3 (three) times a week.     sildenafil  (VIAGRA ) 25 MG tablet Take 1 tablet (25 mg total) by mouth daily as needed for erectile dysfunction. 10 tablet 1   No facility-administered medications prior to visit.    Allergies  Allergen Reactions   No Known Allergies     ROS Review of Systems  Constitutional:  Negative for chills and fever.  HENT:  Negative for congestion and sore throat.   Eyes:  Negative for pain and discharge.  Respiratory:  Negative for cough and shortness of breath.   Cardiovascular:  Negative for chest pain and palpitations.  Gastrointestinal:  Negative for constipation, diarrhea, nausea and vomiting.  Endocrine: Negative for polydipsia and polyuria.  Genitourinary:  Negative for dysuria and hematuria.  Musculoskeletal:  Positive for arthralgias (knee). Negative for neck pain and neck stiffness.  Skin:  Negative for rash.  Neurological:  Negative for dizziness, weakness, numbness and headaches.  Psychiatric/Behavioral:  Negative for agitation and behavioral problems.       Objective:    Physical Exam Vitals reviewed.  Constitutional:      General: He is not in acute distress.    Appearance: He is not diaphoretic.  HENT:     Head: Normocephalic and atraumatic.     Nose: Nose normal.     Mouth/Throat:     Mouth: Mucous membranes are moist.  Eyes:      General: No scleral icterus.    Extraocular Movements: Extraocular movements intact.  Neck:     Vascular: No carotid bruit.     Comments: Left sided submandibular mass, about 1.5-2 cm in diameter, nontender Cardiovascular:     Rate and Rhythm: Normal rate and regular rhythm.     Heart sounds: Normal heart sounds. No murmur heard. Pulmonary:     Breath sounds: Normal breath sounds. No wheezing or rales.  Abdominal:     Palpations: Abdomen is soft.     Tenderness: There is no abdominal tenderness.  Musculoskeletal:        General: Swelling (B/l knee, PIP and DIP joints of b/l hands) present.     Cervical back: Neck supple. No tenderness.     Right lower leg: Edema (1+) present.     Left lower leg: Edema (1+) present.     Comments: Heberden and Bouchard's nodes seen on b/l hands  Feet:     Comments: Bilateral heel skin thickness Skin:    General: Skin is warm.     Findings: No rash.  Neurological:     General: No focal deficit present.     Mental Status: He is alert and oriented to person, place, and time.     Sensory: No sensory deficit.     Motor: No weakness.  Psychiatric:        Mood and Affect: Mood normal.        Behavior: Behavior normal.     BP 126/76 (BP Location: Left Arm)   Pulse 77   Ht 5' 9 (1.753  m)   Wt 166 lb 6.4 oz (75.5 kg)   SpO2 97%   BMI 24.57 kg/m  Wt Readings from Last 3 Encounters:  10/14/24 166 lb 6.4 oz (75.5 kg)  10/28/23 171 lb (77.6 kg)  04/22/23 170 lb 3.2 oz (77.2 kg)    Lab Results  Component Value Date   TSH 2.440 04/23/2024   Lab Results  Component Value Date   WBC 4.3 04/23/2024   HGB 13.8 04/23/2024   HCT 42.5 04/23/2024   MCV 99 (H) 04/23/2024   PLT 187 04/23/2024   Lab Results  Component Value Date   NA 141 04/23/2024   K 4.1 04/23/2024   CO2 22 04/23/2024   GLUCOSE 86 04/23/2024   BUN 13 04/23/2024   CREATININE 0.93 04/23/2024   BILITOT 0.5 04/23/2024   ALKPHOS 79 04/23/2024   AST 38 04/23/2024   ALT 32  04/23/2024   PROT 7.1 04/23/2024   ALBUMIN 4.5 04/23/2024   CALCIUM  9.4 04/23/2024   EGFR 81 04/23/2024   Lab Results  Component Value Date   CHOL 178 04/23/2024   Lab Results  Component Value Date   HDL 82 04/23/2024   Lab Results  Component Value Date   LDLCALC 85 04/23/2024   Lab Results  Component Value Date   TRIG 53 04/23/2024   Lab Results  Component Value Date   CHOLHDL 2.2 04/23/2024   Lab Results  Component Value Date   HGBA1C 5.5 04/22/2023      Assessment & Plan:   Problem List Items Addressed This Visit       Cardiovascular and Mediastinum   Essential hypertension, benign   BP Readings from Last 1 Encounters:  10/14/24 126/76   Well-controlled with Amlodipine  5 mg QD - considering his age Counseled for compliance with the medications Advised DASH diet and moderate exercise/walking, at least 150 mins/week      Relevant Medications   sildenafil  (VIAGRA ) 25 MG tablet   Other Relevant Orders   TSH   CMP14+EGFR   CBC with Differential/Platelet     Respiratory   OSA (obstructive sleep apnea)   Uses CPAP device at nighttime Had dental appliance, but did not tolerate it        Nervous and Auditory   Mild Alzheimer's dementia (HCC)   On Namenda  MoCA: 24/30 previously Independent for ADLs and IADLs.      Relevant Orders   TSH     Other   Mixed hyperlipidemia (Chronic)   On statin Checked lipid profile      Relevant Medications   sildenafil  (VIAGRA ) 25 MG tablet   Other Relevant Orders   Lipid panel   ED (erectile dysfunction)   Sildenafil  PRN, helps him with scrotal discomfort at times      Relevant Medications   sildenafil  (VIAGRA ) 25 MG tablet   Encounter for general adult medical examination with abnormal findings - Primary   Physical exam as documented. Fasting blood tests today. Up to date with age-appropriate vaccinations.      Heel pain, bilateral   Likely due to inadequate heel support Referred to local podiatry  in the past Has skin cracking of the heel, advised to apply lotion or moisturizer       Neck mass   Has nontender mass in submandibular area, had reported it in 11/24 Check US  of neck to rule out LAD versus cyst -ordered again      Relevant Orders   US  Soft Tissue Head/Neck (NON-THYROID )  Other Visit Diagnoses       Vitamin D  deficiency       Relevant Orders   VITAMIN D  25 Hydroxy (Vit-D Deficiency, Fractures)        Meds ordered this encounter  Medications   sildenafil  (VIAGRA ) 25 MG tablet    Sig: Take 1 tablet (25 mg total) by mouth daily as needed for erectile dysfunction.    Dispense:  10 tablet    Refill:  1    Follow-up: Return in about 6 months (around 04/14/2025).    Suzzane MARLA Blanch, MD

## 2024-10-15 LAB — LIPID PANEL
Chol/HDL Ratio: 2.1 ratio (ref 0.0–5.0)
Cholesterol, Total: 186 mg/dL (ref 100–199)
HDL: 88 mg/dL (ref >39)
LDL Chol Calc (NIH): 87 mg/dL (ref 0–99)
Triglycerides: 56 mg/dL (ref 0–149)
VLDL Cholesterol Cal: 11 mg/dL (ref 5–40)

## 2024-10-15 LAB — CBC WITH DIFFERENTIAL/PLATELET
Basophils Absolute: 0 x10E3/uL (ref 0.0–0.2)
Basos: 1 %
EOS (ABSOLUTE): 0.3 x10E3/uL (ref 0.0–0.4)
Eos: 8 %
Hematocrit: 44 % (ref 37.5–51.0)
Hemoglobin: 14.2 g/dL (ref 13.0–17.7)
Immature Grans (Abs): 0 x10E3/uL (ref 0.0–0.1)
Immature Granulocytes: 0 %
Lymphocytes Absolute: 1.5 x10E3/uL (ref 0.7–3.1)
Lymphs: 38 %
MCH: 32.3 pg (ref 26.6–33.0)
MCHC: 32.3 g/dL (ref 31.5–35.7)
MCV: 100 fL — ABNORMAL HIGH (ref 79–97)
Monocytes Absolute: 0.4 x10E3/uL (ref 0.1–0.9)
Monocytes: 10 %
Neutrophils Absolute: 1.7 x10E3/uL (ref 1.4–7.0)
Neutrophils: 43 %
Platelets: 191 x10E3/uL (ref 150–450)
RBC: 4.4 x10E6/uL (ref 4.14–5.80)
RDW: 12 % (ref 11.6–15.4)
WBC: 4 x10E3/uL (ref 3.4–10.8)

## 2024-10-15 LAB — CMP14+EGFR
ALT: 35 IU/L (ref 0–44)
AST: 43 IU/L — ABNORMAL HIGH (ref 0–40)
Albumin: 4.6 g/dL (ref 3.7–4.7)
Alkaline Phosphatase: 71 IU/L (ref 48–129)
BUN/Creatinine Ratio: 12 (ref 10–24)
BUN: 12 mg/dL (ref 8–27)
Bilirubin Total: 0.6 mg/dL (ref 0.0–1.2)
CO2: 20 mmol/L (ref 20–29)
Calcium: 9.5 mg/dL (ref 8.6–10.2)
Chloride: 103 mmol/L (ref 96–106)
Creatinine, Ser: 1.01 mg/dL (ref 0.76–1.27)
Globulin, Total: 2.5 g/dL (ref 1.5–4.5)
Glucose: 86 mg/dL (ref 70–99)
Potassium: 4 mmol/L (ref 3.5–5.2)
Sodium: 141 mmol/L (ref 134–144)
Total Protein: 7.1 g/dL (ref 6.0–8.5)
eGFR: 74 mL/min/1.73 (ref >59)

## 2024-10-15 LAB — TSH: TSH: 2.91 u[IU]/mL (ref 0.450–4.500)

## 2024-10-15 LAB — VITAMIN D 25 HYDROXY (VIT D DEFICIENCY, FRACTURES): Vit D, 25-Hydroxy: 63.4 ng/mL (ref 30.0–100.0)

## 2024-10-17 ENCOUNTER — Ambulatory Visit: Payer: Self-pay | Admitting: Internal Medicine

## 2024-10-21 ENCOUNTER — Ambulatory Visit (HOSPITAL_COMMUNITY)
Admission: RE | Admit: 2024-10-21 | Discharge: 2024-10-21 | Disposition: A | Discharge status: Home / Self Care | Source: Ambulatory Visit | Attending: Internal Medicine | Admitting: Internal Medicine

## 2024-10-21 DIAGNOSIS — R221 Localized swelling, mass and lump, neck: Secondary | ICD-10-CM | POA: Insufficient documentation

## 2024-10-22 ENCOUNTER — Ambulatory Visit (HOSPITAL_COMMUNITY): Admission: RE | Admit: 2024-10-22 | Source: Ambulatory Visit

## 2024-11-26 ENCOUNTER — Other Ambulatory Visit: Payer: Self-pay | Admitting: Internal Medicine

## 2024-12-01 ENCOUNTER — Telehealth: Payer: Self-pay

## 2024-12-01 NOTE — Telephone Encounter (Signed)
 Copied from CRM #[id number]. Topic: General - Other >> Dec 01, 2024  3:52 PM Santiya F wrote: Reason for CRM: Patient is calling in because his CPAP machine is broken and he wants to know if he can get a prescription faxed over to [facility] so he can get a replacement. Fax:[phone]

## 2024-12-02 ENCOUNTER — Telehealth: Payer: Self-pay | Admitting: Internal Medicine

## 2024-12-02 ENCOUNTER — Other Ambulatory Visit: Payer: Self-pay | Admitting: Internal Medicine

## 2024-12-02 DIAGNOSIS — G4733 Obstructive sleep apnea (adult) (pediatric): Secondary | ICD-10-CM

## 2024-12-02 NOTE — Telephone Encounter (Signed)
In provider folder

## 2024-12-02 NOTE — Telephone Encounter (Signed)
 [facility] forms  Noted Copied Sleeved Original placed provider box Mathias) Copy placed front desk folder

## 2024-12-02 NOTE — Telephone Encounter (Signed)
 Faxed

## 2024-12-17 ENCOUNTER — Ambulatory Visit: Payer: Self-pay

## 2024-12-17 NOTE — Telephone Encounter (Signed)
" ° °  FYI Only or Action Required?: Action required by provider: requesting rx for gabapentin for heel burning, declined appt.  Patient was last seen in primary care on 10/14/2024 by Tobie Suzzane POUR, MD.  Called Nurse Triage reporting Foot Pain.  Symptoms began several months ago.  Interventions attempted: Nothing.  Symptoms are: stable.  Triage Disposition: See PCP When Office is Open (Within 3 Days)  Patient/caregiver understands and will follow disposition?: No, wishes to speak with PCP   Copied from CRM #[id number]. Topic: Clinical - Red Word Triage >> Dec 17, 2024  2:08 PM Victoria B wrote: [facility] that prompted transfer to Nurse Triage: patient has burning/stinging sensation in heels Reason for Disposition  [1] MODERATE pain (e.g., interferes with normal activities, limping) AND [2] present > 3 days  Answer Assessment - Initial Assessment Questions States has been rx gabapentin for heel pain by previous provider. Requesting rx, doesn't want to come to the office.    1. ONSET: When did the pain start?      chronic 2. LOCATION: Where is the pain located?      Bilat heels 3. PAIN: How bad is the pain?    (Scale 1-10; or mild, moderate, severe)     8/10 at night 4. WORK OR EXERCISE: Has there been any recent work or exercise that involved this part of the body?      no  Protocols used: Foot Pain-A-AH  "

## 2024-12-28 ENCOUNTER — Ambulatory Visit

## 2025-01-25 ENCOUNTER — Ambulatory Visit

## 2025-04-14 ENCOUNTER — Ambulatory Visit: Admitting: Internal Medicine
